# Patient Record
Sex: Female | Born: 1945 | Race: White | Hispanic: No | Smoking: Never smoker
Health system: Southern US, Community
[De-identification: ages and names within clinical notes are randomized; demographics above are authoritative.]

## PROBLEM LIST (undated history)

## (undated) DIAGNOSIS — I480 Paroxysmal atrial fibrillation: Secondary | ICD-10-CM

## (undated) DIAGNOSIS — E079 Disorder of thyroid, unspecified: Secondary | ICD-10-CM

## (undated) HISTORY — PX: AUGMENTATION MAMMAPLASTY: SUR837

---

## 1997-11-10 ENCOUNTER — Other Ambulatory Visit: Admission: RE | Admit: 1997-11-10 | Discharge: 1997-11-10 | Payer: Self-pay | Admitting: Obstetrics and Gynecology

## 1998-09-03 ENCOUNTER — Other Ambulatory Visit: Admission: RE | Admit: 1998-09-03 | Discharge: 1998-09-03 | Payer: Self-pay | Admitting: Podiatry

## 1999-01-18 ENCOUNTER — Other Ambulatory Visit: Admission: RE | Admit: 1999-01-18 | Discharge: 1999-01-18 | Payer: Self-pay | Admitting: Obstetrics and Gynecology

## 1999-01-27 ENCOUNTER — Encounter: Payer: Self-pay | Admitting: Obstetrics and Gynecology

## 1999-01-27 ENCOUNTER — Encounter: Admission: RE | Admit: 1999-01-27 | Discharge: 1999-01-27 | Payer: Self-pay | Admitting: Obstetrics and Gynecology

## 2000-02-03 ENCOUNTER — Encounter: Payer: Self-pay | Admitting: Obstetrics and Gynecology

## 2000-02-03 ENCOUNTER — Encounter: Admission: RE | Admit: 2000-02-03 | Discharge: 2000-02-03 | Payer: Self-pay | Admitting: Obstetrics and Gynecology

## 2000-03-23 ENCOUNTER — Other Ambulatory Visit: Admission: RE | Admit: 2000-03-23 | Discharge: 2000-03-23 | Payer: Self-pay | Admitting: Obstetrics and Gynecology

## 2000-03-28 ENCOUNTER — Encounter: Admission: RE | Admit: 2000-03-28 | Discharge: 2000-03-28 | Payer: Self-pay | Admitting: Obstetrics and Gynecology

## 2000-03-28 ENCOUNTER — Encounter: Payer: Self-pay | Admitting: Obstetrics and Gynecology

## 2001-03-29 ENCOUNTER — Encounter: Admission: RE | Admit: 2001-03-29 | Discharge: 2001-03-29 | Payer: Self-pay | Admitting: Obstetrics and Gynecology

## 2001-03-29 ENCOUNTER — Encounter: Payer: Self-pay | Admitting: Obstetrics and Gynecology

## 2001-08-12 ENCOUNTER — Encounter: Payer: Self-pay | Admitting: Emergency Medicine

## 2001-08-12 ENCOUNTER — Emergency Department (HOSPITAL_COMMUNITY): Admission: EM | Admit: 2001-08-12 | Discharge: 2001-08-12 | Payer: Self-pay | Admitting: Emergency Medicine

## 2002-03-13 ENCOUNTER — Other Ambulatory Visit: Admission: RE | Admit: 2002-03-13 | Discharge: 2002-03-13 | Payer: Self-pay | Admitting: Obstetrics and Gynecology

## 2002-04-04 ENCOUNTER — Encounter: Payer: Self-pay | Admitting: Obstetrics and Gynecology

## 2002-04-04 ENCOUNTER — Encounter: Admission: RE | Admit: 2002-04-04 | Discharge: 2002-04-04 | Payer: Self-pay | Admitting: Obstetrics and Gynecology

## 2002-12-11 ENCOUNTER — Inpatient Hospital Stay (HOSPITAL_COMMUNITY): Admission: EM | Admit: 2002-12-11 | Discharge: 2002-12-12 | Payer: Self-pay | Admitting: Emergency Medicine

## 2003-04-09 ENCOUNTER — Encounter: Admission: RE | Admit: 2003-04-09 | Discharge: 2003-04-09 | Payer: Self-pay | Admitting: Obstetrics and Gynecology

## 2003-09-10 ENCOUNTER — Other Ambulatory Visit: Admission: RE | Admit: 2003-09-10 | Discharge: 2003-09-10 | Payer: Self-pay | Admitting: Obstetrics and Gynecology

## 2004-05-26 ENCOUNTER — Encounter: Admission: RE | Admit: 2004-05-26 | Discharge: 2004-05-26 | Payer: Self-pay | Admitting: Obstetrics and Gynecology

## 2005-07-22 ENCOUNTER — Emergency Department (HOSPITAL_COMMUNITY): Admission: EM | Admit: 2005-07-22 | Discharge: 2005-07-22 | Payer: Self-pay | Admitting: Emergency Medicine

## 2006-01-29 ENCOUNTER — Encounter: Admission: RE | Admit: 2006-01-29 | Discharge: 2006-01-29 | Payer: Self-pay | Admitting: Obstetrics and Gynecology

## 2007-02-18 ENCOUNTER — Encounter: Admission: RE | Admit: 2007-02-18 | Discharge: 2007-02-18 | Payer: Self-pay | Admitting: Obstetrics and Gynecology

## 2008-03-02 ENCOUNTER — Encounter: Admission: RE | Admit: 2008-03-02 | Discharge: 2008-03-02 | Payer: Self-pay | Admitting: Obstetrics and Gynecology

## 2009-03-15 ENCOUNTER — Encounter: Admission: RE | Admit: 2009-03-15 | Discharge: 2009-03-15 | Payer: Self-pay | Admitting: Obstetrics and Gynecology

## 2010-01-30 ENCOUNTER — Encounter: Payer: Self-pay | Admitting: Obstetrics and Gynecology

## 2010-02-18 ENCOUNTER — Other Ambulatory Visit: Payer: Self-pay | Admitting: Obstetrics and Gynecology

## 2010-02-18 DIAGNOSIS — Z1231 Encounter for screening mammogram for malignant neoplasm of breast: Secondary | ICD-10-CM

## 2010-03-28 ENCOUNTER — Ambulatory Visit
Admission: RE | Admit: 2010-03-28 | Discharge: 2010-03-28 | Disposition: A | Discharge status: Home / Self Care | Payer: BC Managed Care – PPO | Source: Ambulatory Visit | Attending: Obstetrics and Gynecology | Admitting: Obstetrics and Gynecology

## 2010-03-28 DIAGNOSIS — Z1231 Encounter for screening mammogram for malignant neoplasm of breast: Secondary | ICD-10-CM

## 2010-03-31 ENCOUNTER — Other Ambulatory Visit: Payer: Self-pay | Admitting: Obstetrics and Gynecology

## 2010-03-31 DIAGNOSIS — R928 Other abnormal and inconclusive findings on diagnostic imaging of breast: Secondary | ICD-10-CM

## 2010-04-06 ENCOUNTER — Ambulatory Visit
Admission: RE | Admit: 2010-04-06 | Discharge: 2010-04-06 | Disposition: A | Discharge status: Home / Self Care | Payer: BC Managed Care – PPO | Source: Ambulatory Visit | Attending: Obstetrics and Gynecology | Admitting: Obstetrics and Gynecology

## 2010-04-06 DIAGNOSIS — R928 Other abnormal and inconclusive findings on diagnostic imaging of breast: Secondary | ICD-10-CM

## 2011-04-10 ENCOUNTER — Other Ambulatory Visit: Payer: Self-pay | Admitting: Obstetrics and Gynecology

## 2011-04-10 DIAGNOSIS — Z1231 Encounter for screening mammogram for malignant neoplasm of breast: Secondary | ICD-10-CM

## 2011-04-19 ENCOUNTER — Ambulatory Visit
Admission: RE | Admit: 2011-04-19 | Discharge: 2011-04-19 | Disposition: A | Discharge status: Home / Self Care | Payer: BC Managed Care – PPO | Source: Ambulatory Visit | Attending: Obstetrics and Gynecology | Admitting: Obstetrics and Gynecology

## 2011-04-19 DIAGNOSIS — Z1231 Encounter for screening mammogram for malignant neoplasm of breast: Secondary | ICD-10-CM

## 2012-04-01 ENCOUNTER — Other Ambulatory Visit: Payer: Self-pay | Admitting: Obstetrics and Gynecology

## 2012-04-01 ENCOUNTER — Other Ambulatory Visit: Payer: Self-pay

## 2012-04-01 DIAGNOSIS — Z1231 Encounter for screening mammogram for malignant neoplasm of breast: Secondary | ICD-10-CM

## 2012-05-08 ENCOUNTER — Ambulatory Visit
Admission: RE | Admit: 2012-05-08 | Discharge: 2012-05-08 | Disposition: A | Discharge status: Home / Self Care | Payer: Medicare Other | Source: Ambulatory Visit

## 2012-05-08 DIAGNOSIS — Z1231 Encounter for screening mammogram for malignant neoplasm of breast: Secondary | ICD-10-CM

## 2013-05-23 ENCOUNTER — Other Ambulatory Visit: Payer: Self-pay

## 2013-05-23 DIAGNOSIS — Z1231 Encounter for screening mammogram for malignant neoplasm of breast: Secondary | ICD-10-CM

## 2013-06-18 ENCOUNTER — Ambulatory Visit
Admission: RE | Admit: 2013-06-18 | Discharge: 2013-06-18 | Disposition: A | Discharge status: Home / Self Care | Payer: Medicare Other | Source: Ambulatory Visit

## 2013-06-18 ENCOUNTER — Encounter (INDEPENDENT_AMBULATORY_CARE_PROVIDER_SITE_OTHER): Payer: Self-pay

## 2013-06-18 DIAGNOSIS — Z1231 Encounter for screening mammogram for malignant neoplasm of breast: Secondary | ICD-10-CM

## 2014-06-16 ENCOUNTER — Other Ambulatory Visit: Payer: Self-pay

## 2014-06-16 DIAGNOSIS — Z1231 Encounter for screening mammogram for malignant neoplasm of breast: Secondary | ICD-10-CM

## 2014-06-29 ENCOUNTER — Ambulatory Visit
Admission: RE | Admit: 2014-06-29 | Discharge: 2014-06-29 | Disposition: A | Discharge status: Home / Self Care | Payer: Medicare Other | Source: Ambulatory Visit

## 2014-06-29 DIAGNOSIS — Z1231 Encounter for screening mammogram for malignant neoplasm of breast: Secondary | ICD-10-CM

## 2015-05-16 ENCOUNTER — Emergency Department (HOSPITAL_BASED_OUTPATIENT_CLINIC_OR_DEPARTMENT_OTHER): Payer: Medicare Other

## 2015-05-16 ENCOUNTER — Encounter (HOSPITAL_BASED_OUTPATIENT_CLINIC_OR_DEPARTMENT_OTHER): Payer: Self-pay

## 2015-05-16 ENCOUNTER — Emergency Department (HOSPITAL_BASED_OUTPATIENT_CLINIC_OR_DEPARTMENT_OTHER)
Admission: EM | Admit: 2015-05-16 | Discharge: 2015-05-16 | Disposition: A | Discharge status: Home / Self Care | Payer: Medicare Other | Attending: Emergency Medicine | Admitting: Emergency Medicine

## 2015-05-16 DIAGNOSIS — M549 Dorsalgia, unspecified: Secondary | ICD-10-CM | POA: Diagnosis not present

## 2015-05-16 DIAGNOSIS — Z7982 Long term (current) use of aspirin: Secondary | ICD-10-CM | POA: Diagnosis not present

## 2015-05-16 DIAGNOSIS — Z79899 Other long term (current) drug therapy: Secondary | ICD-10-CM | POA: Insufficient documentation

## 2015-05-16 DIAGNOSIS — I491 Atrial premature depolarization: Secondary | ICD-10-CM | POA: Diagnosis not present

## 2015-05-16 DIAGNOSIS — I493 Ventricular premature depolarization: Secondary | ICD-10-CM | POA: Insufficient documentation

## 2015-05-16 DIAGNOSIS — R0602 Shortness of breath: Secondary | ICD-10-CM | POA: Diagnosis present

## 2015-05-16 DIAGNOSIS — R0600 Dyspnea, unspecified: Secondary | ICD-10-CM

## 2015-05-16 HISTORY — DX: Disorder of thyroid, unspecified: E07.9

## 2015-05-16 LAB — TROPONIN I: TROPONIN I: 0.03 ng/mL (ref <0.031)

## 2015-05-16 LAB — BASIC METABOLIC PANEL
Anion gap: 5 (ref 5–15)
BUN: 17 mg/dL (ref 6–20)
CALCIUM: 9.2 mg/dL (ref 8.9–10.3)
CO2: 27 mmol/L (ref 22–32)
CREATININE: 0.95 mg/dL (ref 0.44–1.00)
Chloride: 107 mmol/L (ref 101–111)
GFR calc Af Amer: >60 mL/min (ref >60)
Glucose, Bld: 185 mg/dL — ABNORMAL HIGH (ref 65–99)
Potassium: 3.9 mmol/L (ref 3.5–5.1)
SODIUM: 139 mmol/L (ref 135–145)

## 2015-05-16 LAB — CBC WITH DIFFERENTIAL/PLATELET
BASOS ABS: 0 10*3/uL (ref 0.0–0.1)
BASOS PCT: 0 %
EOS PCT: 1 %
Eosinophils Absolute: 0.1 10*3/uL (ref 0.0–0.7)
HCT: 38.6 % (ref 36.0–46.0)
Hemoglobin: 13.1 g/dL (ref 12.0–15.0)
Lymphocytes Relative: 40 %
Lymphs Abs: 2.7 10*3/uL (ref 0.7–4.0)
MCH: 33.1 pg (ref 26.0–34.0)
MCHC: 33.9 g/dL (ref 30.0–36.0)
MCV: 97.5 fL (ref 78.0–100.0)
MONO ABS: 0.6 10*3/uL (ref 0.1–1.0)
Monocytes Relative: 9 %
Neutro Abs: 3.3 10*3/uL (ref 1.7–7.7)
Neutrophils Relative %: 50 %
PLATELETS: 277 10*3/uL (ref 150–400)
RBC: 3.96 MIL/uL (ref 3.87–5.11)
RDW: 12.5 % (ref 11.5–15.5)
WBC: 6.7 10*3/uL (ref 4.0–10.5)

## 2015-05-16 LAB — D-DIMER, QUANTITATIVE: D-Dimer, Quant: 2.18 ug/mL-FEU — ABNORMAL HIGH (ref 0.00–0.50)

## 2015-05-16 MED ORDER — IOPAMIDOL (ISOVUE-370) INJECTION 76%
100.0000 mL | Freq: Once | INTRAVENOUS | Status: AC | PRN
  Administered 2015-05-16: 100 mL via INTRAVENOUS

## 2015-05-16 NOTE — ED Provider Notes (Signed)
CSN: [id number]     Arrival date & time 05/16/15  1906 History  By signing my name below, I, Phillis Haggis, attest that this documentation has been prepared under the direction and in the presence of Benjiman Core, MD. Electronically Signed: Phillis Haggis, ED Scribe. 05/16/2015. 7:33 PM.   Chief Complaint  Patient presents with  . Shortness of Breath   The history is provided by the patient. No language interpreter was used.  HPI Comments: Tamyka D Schoenfelder is a 70 y.o. female who presents to the Emergency Department complaining of constant, gradually worsening SOB onset 2 days ago. She states that it is hard for her to take a deep breath. Pt reports associated chest "heaviness" and aching midline back pain. She has a mild non-productive cough. Pt is currently wearing a heart monitor by her PCP investing possible irregular heartbeat. She has been wearing this monitor since last Wednesday with no alerts. Pt has been having intermittent palpitations, but not as much since the monitor has been placed. Pt states that she played sports on 05/13/15 with no limitations of activity. She denies hx of similar symptoms, recent long travel, fever, chills, or leg swelling. Pt is not a smoker.  Past Medical History  Diagnosis Date  . Thyroid disease    History reviewed. No pertinent past surgical history. No family history on file. Social History  Substance Use Topics  . Smoking status: Never Smoker   . Smokeless tobacco: None  . Alcohol Use: No   OB History    No data available     Review of Systems  Constitutional: Negative for fever and chills.  Respiratory: Positive for cough and shortness of breath.   Cardiovascular: Positive for chest pain. Negative for leg swelling.  Musculoskeletal: Positive for back pain.  All other systems reviewed and are negative.  Allergies  Review of patient's allergies indicates no known allergies.  Home Medications   Prior to Admission medications    Medication Sig Start Date End Date Taking? Authorizing Provider  aspirin (ASPIRIN EC) 81 MG EC tablet Take 81 mg by mouth daily. Swallow whole.   Yes Historical Provider, MD  levothyroxine (SYNTHROID, LEVOTHROID) 88 MCG tablet Take 88 mcg by mouth daily before breakfast.   Yes Historical Provider, MD   BP 133/85 mmHg  Pulse 73  Temp(Src) 98.3 F (36.8 C) (Oral)  Resp 23  SpO2 93% Physical Exam  Constitutional: She is oriented to person, place, and time. She appears well-developed and well-nourished.  HENT:  Head: Normocephalic and atraumatic.  Eyes: EOM are normal. Pupils are equal, round, and reactive to light.  Neck: Normal range of motion. Neck supple.  Cardiovascular: Normal rate, regular rhythm and normal heart sounds.  Exam reveals no gallop and no friction rub.   No murmur heard. Pulmonary/Chest: Effort normal and breath sounds normal. She has no wheezes.  Abdominal: Soft. There is no tenderness.  Musculoskeletal: Normal range of motion. She exhibits no edema.  No peripheral edema  Neurological: She is alert and oriented to person, place, and time.  Skin: Skin is warm and dry.  Psychiatric: She has a normal mood and affect. Her behavior is normal.  Nursing note and vitals reviewed.   ED Course  Procedures (including critical care time) DIAGNOSTIC STUDIES: Oxygen Saturation is 99% on RA, normal by my interpretation.    COORDINATION OF CARE: 7:29 PM-Discussed treatment plan which includes labs, EKG and x-ray with pt at bedside and pt agreed to plan.    Labs  Review Labs Reviewed  BASIC METABOLIC PANEL - Abnormal; Notable for the following:    Glucose, Bld 185 (*)    All other components within normal limits  D-[facility], [facility]ANTITAT[facility] ([facility]) - Abnormal; Notable for the following:    D-Dimer, Quant 2.18 (*)    All other components within normal limits  TROPONIN I  CBC WITH DIFFERENTIAL/PLATELET    Imaging Review Dg Chest 2 View  05/16/2015  CLINICAL DATA:   70 year old female with shortness of breath and chest heaviness. EXAM: CHEST  2 VIEW COMPARISON:  None. FINDINGS: Two views of the chest demonstrate minimal bibasilar atelectatic changes. There is no focal consolidation. There is slight blunting of the costophrenic angles which may be related to chronic changes/scarring or represent a small amount of pleural effusion. There is mild increased thoracic AP diameter concerning for underlying obstructive lung disease. There is no pneumothorax. The cardiac silhouette is within normal limits. Bilateral breast implants noted. The visualized soft tissues and the osseous structures are grossly unremarkable. IMPRESSION: Evidence of chronic obstructive pulmonary disease with possible trace bilateral pleural effusions. No focal consolidation. Electronically Signed   By: Elgie CollardArash  Radparvar M.D.   On: 05/16/2015 20:10   Ct Angio Chest Pe W/cm &/or Wo Cm  05/16/2015  CLINICAL DATA:  Increasing dyspnea and cough. EXAM: CT ANGIOGRAPHY CHEST WITH CONTRAST TECHNIQUE: Multidetector CT imaging of the chest was performed using the standard protocol during bolus administration of intravenous contrast. Multiplanar CT image reconstructions and MIPs were obtained to evaluate the vascular anatomy. CONTRAST:  100 mL Isovue 370 intravenous COMPARISON:  Radiographs 05/16/2015 FINDINGS: Cardiovascular: There is good opacification of the pulmonary arteries. There is no pulmonary embolism. The thoracic aorta is normal in caliber and intact. Lungs: Clear Central airways: Normal Effusions: Small pleural effusions bilaterally. Lymphadenopathy: None Esophagus: Unremarkable Upper abdomen: Small hiatal hernia. Musculoskeletal: No significant skeletal lesion. Moderate thoracic degenerative disc disease. Review of the MIP images confirms the above findings. IMPRESSION: Negative for acute pulmonary embolism. There are small pleural effusions bilaterally. There is a small hiatal hernia. Electronically Signed    By: Ellery Plunkaniel R Mitchell M.D.   On: 05/16/2015 22:27   I have personally reviewed and evaluated these images and lab results as part of my medical decision-making.   EKG Interpretation None     ED ECG REPORT   Date: 05/17/2015  Rate: 70  Rhythm: normal sinus rhythm and premature ventricular contractions (PVC)  QRS Axis: normal  Intervals: normal  ST/T Wave abnormalities: normal  Conduction Disutrbances:none  Narrative Interpretation:   Old EKG Reviewed: none available    MDM   Final diagnoses:  Dyspnea  PAC (premature atrial contraction)  PVC (premature ventricular contraction)    Patient with dyspnea chest pain and palpitations. Has frequent PACs and PVCs. D-dimer done and was positive. CT scan reassuring. Will discharge home. Has follow-up with his primary care doctor. She is currently wearing monitor also. I personally performed the services described in this documentation, which was scribed in my presence. The recorded information has been reviewed and is accurate.      Benjiman CoreNathan Opal Dinning, MD 05/17/15 0002

## 2015-05-16 NOTE — ED Notes (Signed)
Patient transported to X-ray 

## 2015-05-16 NOTE — Discharge Instructions (Signed)
Premature Atrial Contraction Premature atrial contractions (PACs) happen when your heart beats before it has had time to fill with blood. Your heart then has to pause until it can fill with blood for the next beat. This causes the next beat to be more forceful. PACs are also called skipped heartbeats because it may feel like your heart stops for a second.  Your heart has four chambers. There are two upper chambers (atria) and two lower chambers (ventricles). All the chambers need to work together to pump blood properly. Electrical signals spread across your heart and make all the chambers beat together.The signal to beat starts in your atria. If the atria fire a bit early, you may have a PAC.  CAUSES  The cause of a PAC is often unknown. PACs are sometimes caused by heart disease or injury. RISK FACTORS PACs are more common in children and older people. Other risk factors that may trigger PACs include:  Caffeine.  Stress.  Fatigue.  Alcohol.  Smoking.  Stimulant drugs. These may be prescription or illegal drugs.  Heart disease. SIGNS AND SYMPTOMS PACs are very common, especially in children and people 50 years and older. PACs do not cause dizziness, shortness of breath, or chest pain. The only symptom of a PAC is the sensation of a skipped or fluttering heartbeat.  DIAGNOSIS  Your health care provider can diagnose PAC based on the description of your symptom. Your health care provider may also:  Perform a physical exam to listen to your heart. Your heart may sound normal during this exam.  Perform tests to rule out other conditions. These tests may include an electrical tracing of your heart called electrocardiogram (ECG). You may need to wear a portable ECG machine (Holter monitor) that records your heart for 24 hours or more. TREATMENT  In most cases, PACs do not need to be treated. If you have frequent PACs that are caused by heart disease, you may be treated for the underlying  condition. HOME CARE INSTRUCTIONS  Do not use any tobacco products, including cigarettes, chewing tobacco, or electronic cigarettes. If you need help quitting, ask your health care provider.  Limit alcohol intake to no more than 1 drink per day for nonpregnant women and 2 drinks per day for men. One drink equals 12 ounces of beer, 5 ounces of wine, or 1 ounces of hard liquor.  Limit the amount of caffeine you take in.  Do not use illegal drugs.  Get at least 8 hours of sleep every night.  Find healthy ways to manage stress.  Get regular exercise. Ask your health care provider to suggest some activities that are safe for you. SEEK MEDICAL CARE IF:  You feel your heart skipping beats often (more than once a day).  Your heart skips beats and you feel dizzy, lightheaded, or very tired. SEEK IMMEDIATE MEDICAL CARE IF:   You have chest pain.  You have trouble breathing.   This information is not intended to replace advice given to you by your health care provider. Make sure you discuss any questions you have with your health care provider.   Document Released: 08/29/2013 Document Revised: 05/12/2014 Document Reviewed: 08/29/2013 Elsevier Interactive Patient Education 2016 Elsevier Inc.  Premature Ventricular Contraction A premature ventricular contraction is an irregularity in the normal heart rhythm. These contractions are extra heartbeats that occur too early in the normal sequence. In most cases, these contractions are harmless and do not require treatment. CAUSES Premature ventricular contractions may occur without  a known cause. In healthy people, the extra contractions may be caused by:  Smoking.  Drinking alcohol.  Caffeine.  Certain medicines.  Some illegal drugs.  Stress. Sometimes, changes in chemicals in the blood (electrolytes) can also cause premature ventricular contractions. They can also occur in people with heart diseases that cause a decrease in blood  flow to the heart. SIGNS AND SYMPTOMS Premature ventricular contractions often do not cause any symptoms. In some cases, you may have a feeling of your heart beating fast or skipping a beat (palpitations). DIAGNOSIS Your health care provider will take your medical history and do a physical exam. During the exam, the health care provider will check for irregular heartbeats. Various tests may be done to help diagnose premature ventricular contractions. These tests may include:  An ECG (electrocardiogram) to monitor the electrical activity of your heart.  Holter monitor testing. A Holter monitor is a portable device that can monitor the electrical activity of your heart over longer periods of time.  Stress tests to see how exercise affects your heart rhythm.  Echocardiogram. This test uses sound waves (ultrasound) to produce an image of your heart.  Electrophysiology study. This is used to evaluate the electrical conduction system of your heart. TREATMENT Usually, no treatment is needed. You may be advised to avoid things that can trigger the premature contractions, such as caffeine or alcohol. Medicines are sometimes given if symptoms are severe or if the extra heartbeats are very frequent. Treatment may also be needed for an underlying cause of the contractions if one is found. HOME CARE INSTRUCTIONS  Take medicines only as directed by your health care provider.  Make any lifestyle changes recommended by your health care provider. These may include:  Quitting smoking.  Avoiding or limiting caffeine or alcohol.  Exercising. Talk to your health care provider about what type of exercise is safe for you.  Trying to reduce stress.  Keep all follow-up visits with your health care provider. This is important. SEEK IMMEDIATE MEDICAL CARE IF:  You feel palpitations that are frequent or continual.  You have chest pain.  You have shortness of breath.  You have sweating for no  reason.  You have nausea and vomiting.  You become light-headed or faint.   This information is not intended to replace advice given to you by your health care provider. Make sure you discuss any questions you have with your health care provider.   Document Released: 08/13/2003 Document Revised: 01/16/2014 Document Reviewed: 05/29/2013 Elsevier Interactive Patient Education [employer] Elsevier Inc.

## 2015-05-16 NOTE — ED Notes (Signed)
Fluctuations of oxygen level present with PAC and PVC down from 98% to 92 %. Placed pt on 1LNC due to pt's c/o of SOB and current reading.

## 2015-05-16 NOTE — ED Notes (Signed)
Nurse First: Pt observed walking in, attempting to catch break. Oxygen sats: 99% and HR 72.

## 2015-05-16 NOTE — ED Notes (Signed)
Pt reports having some shortness of breath and chest "heaviness" x 2 days. Reports she is wearing heart monitor due to PCP investigating irregular heartbeat.

## 2015-07-07 ENCOUNTER — Other Ambulatory Visit: Payer: Self-pay | Admitting: Obstetrics and Gynecology

## 2015-07-07 DIAGNOSIS — Z1231 Encounter for screening mammogram for malignant neoplasm of breast: Secondary | ICD-10-CM

## 2015-08-02 ENCOUNTER — Ambulatory Visit
Admission: RE | Admit: 2015-08-02 | Discharge: 2015-08-02 | Disposition: A | Discharge status: Home / Self Care | Payer: Medicare Other | Source: Ambulatory Visit | Attending: Obstetrics and Gynecology | Admitting: Obstetrics and Gynecology

## 2015-08-02 DIAGNOSIS — Z1231 Encounter for screening mammogram for malignant neoplasm of breast: Secondary | ICD-10-CM

## 2016-08-02 ENCOUNTER — Other Ambulatory Visit: Payer: Self-pay | Admitting: Obstetrics and Gynecology

## 2016-08-02 DIAGNOSIS — Z1239 Encounter for other screening for malignant neoplasm of breast: Secondary | ICD-10-CM

## 2016-08-28 ENCOUNTER — Ambulatory Visit
Admission: RE | Admit: 2016-08-28 | Discharge: 2016-08-28 | Disposition: A | Discharge status: Home / Self Care | Payer: Medicare Other | Source: Ambulatory Visit | Attending: Obstetrics and Gynecology | Admitting: Obstetrics and Gynecology

## 2016-08-28 DIAGNOSIS — Z1239 Encounter for other screening for malignant neoplasm of breast: Secondary | ICD-10-CM

## 2017-10-17 ENCOUNTER — Other Ambulatory Visit: Payer: Self-pay | Admitting: Obstetrics and Gynecology

## 2017-10-17 DIAGNOSIS — Z1231 Encounter for screening mammogram for malignant neoplasm of breast: Secondary | ICD-10-CM

## 2017-11-26 ENCOUNTER — Other Ambulatory Visit: Payer: Self-pay | Admitting: Family Medicine

## 2017-11-26 ENCOUNTER — Ambulatory Visit
Admission: RE | Admit: 2017-11-26 | Discharge: 2017-11-26 | Disposition: A | Discharge status: Home / Self Care | Payer: Medicare Other | Source: Ambulatory Visit | Attending: Obstetrics and Gynecology | Admitting: Obstetrics and Gynecology

## 2017-11-26 DIAGNOSIS — Z1231 Encounter for screening mammogram for malignant neoplasm of breast: Secondary | ICD-10-CM

## 2018-11-18 ENCOUNTER — Other Ambulatory Visit: Payer: Self-pay | Admitting: Family Medicine

## 2018-11-18 DIAGNOSIS — Z1231 Encounter for screening mammogram for malignant neoplasm of breast: Secondary | ICD-10-CM

## 2019-01-09 ENCOUNTER — Other Ambulatory Visit: Payer: Self-pay

## 2019-01-09 ENCOUNTER — Ambulatory Visit
Admission: RE | Admit: 2019-01-09 | Discharge: 2019-01-09 | Disposition: A | Discharge status: Home / Self Care | Payer: Medicare Other | Source: Ambulatory Visit | Attending: Family Medicine | Admitting: Family Medicine

## 2019-01-09 DIAGNOSIS — Z1231 Encounter for screening mammogram for malignant neoplasm of breast: Secondary | ICD-10-CM

## 2019-01-14 ENCOUNTER — Other Ambulatory Visit: Payer: Self-pay | Admitting: Family Medicine

## 2019-01-14 DIAGNOSIS — R928 Other abnormal and inconclusive findings on diagnostic imaging of breast: Secondary | ICD-10-CM

## 2019-01-15 ENCOUNTER — Other Ambulatory Visit: Payer: Self-pay | Admitting: Family Medicine

## 2019-01-15 DIAGNOSIS — R928 Other abnormal and inconclusive findings on diagnostic imaging of breast: Secondary | ICD-10-CM

## 2019-01-16 ENCOUNTER — Other Ambulatory Visit: Payer: Self-pay

## 2019-01-16 ENCOUNTER — Ambulatory Visit: Payer: Medicare Other

## 2019-01-16 ENCOUNTER — Ambulatory Visit
Admission: RE | Admit: 2019-01-16 | Discharge: 2019-01-16 | Disposition: A | Discharge status: Home / Self Care | Payer: Medicare Other | Source: Ambulatory Visit | Attending: Family Medicine | Admitting: Family Medicine

## 2019-01-16 DIAGNOSIS — R928 Other abnormal and inconclusive findings on diagnostic imaging of breast: Secondary | ICD-10-CM

## 2019-03-09 ENCOUNTER — Ambulatory Visit: Payer: Medicare PPO | Attending: Internal Medicine

## 2019-03-09 DIAGNOSIS — Z23 Encounter for immunization: Secondary | ICD-10-CM | POA: Insufficient documentation

## 2019-03-09 NOTE — Progress Notes (Signed)
   Covid-19 Vaccination Clinic  Name:  Alyssa Wheeler    MRN: [id number] DOB: 01-12-1945  03/09/2019  Alyssa Wheeler was observed post Covid-19 immunization for 15 minutes without incidence. She was provided with Vaccine Information Sheet and instruction to access the V-Safe system.   Alyssa Wheeler was instructed to call 911 with any severe reactions post vaccine: Marland Kitchen Difficulty breathing  . Swelling of your face and throat  . A fast heartbeat  . A bad rash all over your body  . Dizziness and weakness    Immunizations Administered    Name Date Dose VIS Date Route   Pfizer COVID-19 Vaccine 03/09/2019  3:54 PM 0.3 mL 12/20/2018 Intramuscular   Manufacturer: [employer], [employer]   Lot: [id number]   NDC: [address]-9

## 2019-04-08 ENCOUNTER — Ambulatory Visit: Payer: Medicare PPO | Attending: Internal Medicine

## 2019-04-08 DIAGNOSIS — Z23 Encounter for immunization: Secondary | ICD-10-CM

## 2019-04-08 NOTE — Progress Notes (Signed)
   Covid-19 Vaccination Clinic  Name:  Alyssa Wheeler    MRN: [id number] DOB: 1945/04/16  04/08/2019  [name] was observed post Covid-19 immunization for 15 minutes without incident. She was provided with Vaccine Information Sheet and instruction to access the V-Safe system.   [name] was instructed to call 911 with any severe reactions post vaccine: Marland Kitchen Difficulty breathing  . Swelling of face and throat  . A fast heartbeat  . A bad rash all over body  . Dizziness and weakness   Immunizations Administered    Name Date Dose VIS Date Route   Pfizer COVID-19 Vaccine 04/08/2019  1:55 PM 0.3 mL 12/20/2018 Intramuscular   Manufacturer: [employer], [employer]   Lot: [id number]   NDC: [address]-4

## 2020-02-23 ENCOUNTER — Other Ambulatory Visit: Payer: Self-pay | Admitting: Family Medicine

## 2020-02-23 DIAGNOSIS — Z1231 Encounter for screening mammogram for malignant neoplasm of breast: Secondary | ICD-10-CM

## 2020-04-12 ENCOUNTER — Other Ambulatory Visit: Payer: Self-pay

## 2020-04-12 ENCOUNTER — Ambulatory Visit
Admission: RE | Admit: 2020-04-12 | Discharge: 2020-04-12 | Disposition: A | Discharge status: Home / Self Care | Payer: Medicare PPO | Source: Ambulatory Visit | Attending: Family Medicine | Admitting: Family Medicine

## 2020-04-12 DIAGNOSIS — Z1231 Encounter for screening mammogram for malignant neoplasm of breast: Secondary | ICD-10-CM

## 2020-12-23 ENCOUNTER — Encounter (HOSPITAL_BASED_OUTPATIENT_CLINIC_OR_DEPARTMENT_OTHER): Payer: Self-pay | Admitting: Emergency Medicine

## 2020-12-23 ENCOUNTER — Other Ambulatory Visit: Payer: Self-pay

## 2020-12-23 ENCOUNTER — Emergency Department (HOSPITAL_BASED_OUTPATIENT_CLINIC_OR_DEPARTMENT_OTHER)
Admission: EM | Admit: 2020-12-23 | Discharge: 2020-12-24 | Disposition: A | Discharge status: Other Acute Inpatient Hospital | Payer: Medicare PPO | Attending: Emergency Medicine | Admitting: Emergency Medicine

## 2020-12-23 DIAGNOSIS — Z7982 Long term (current) use of aspirin: Secondary | ICD-10-CM | POA: Diagnosis not present

## 2020-12-23 DIAGNOSIS — I248 Other forms of acute ischemic heart disease: Secondary | ICD-10-CM | POA: Diagnosis not present

## 2020-12-23 DIAGNOSIS — I498 Other specified cardiac arrhythmias: Secondary | ICD-10-CM | POA: Diagnosis not present

## 2020-12-23 DIAGNOSIS — R002 Palpitations: Secondary | ICD-10-CM | POA: Diagnosis present

## 2020-12-23 DIAGNOSIS — Z20822 Contact with and (suspected) exposure to covid-19: Secondary | ICD-10-CM | POA: Insufficient documentation

## 2020-12-23 DIAGNOSIS — I4891 Unspecified atrial fibrillation: Secondary | ICD-10-CM | POA: Diagnosis not present

## 2020-12-23 HISTORY — DX: Paroxysmal atrial fibrillation: I48.0

## 2020-12-23 LAB — CBC WITH DIFFERENTIAL/PLATELET
Abs Immature Granulocytes: 0.03 10*3/uL (ref 0.00–0.07)
Basophils Absolute: 0.1 10*3/uL (ref 0.0–0.1)
Basophils Relative: 1 %
Eosinophils Absolute: 0.1 10*3/uL (ref 0.0–0.5)
Eosinophils Relative: 1 %
HCT: 39.7 % (ref 36.0–46.0)
Hemoglobin: 13.6 g/dL (ref 12.0–15.0)
Immature Granulocytes: 0 %
Lymphocytes Relative: 31 %
Lymphs Abs: 3 10*3/uL (ref 0.7–4.0)
MCH: 32.9 pg (ref 26.0–34.0)
MCHC: 34.3 g/dL (ref 30.0–36.0)
MCV: 95.9 fL (ref 80.0–100.0)
Monocytes Absolute: 0.9 10*3/uL (ref 0.1–1.0)
Monocytes Relative: 9 %
Neutro Abs: 5.8 10*3/uL (ref 1.7–7.7)
Neutrophils Relative %: 58 %
Platelets: 270 10*3/uL (ref 150–400)
RBC: 4.14 MIL/uL (ref 3.87–5.11)
RDW: 12.5 % (ref 11.5–15.5)
WBC: 9.8 10*3/uL (ref 4.0–10.5)
nRBC: 0 % (ref 0.0–0.2)

## 2020-12-23 MED ORDER — DILTIAZEM LOAD VIA INFUSION
20.0000 mg | Freq: Once | INTRAVENOUS | Status: AC
  Administered 2020-12-23: 20 mg via INTRAVENOUS
  Filled 2020-12-23: qty 20

## 2020-12-23 MED ORDER — DILTIAZEM HCL-DEXTROSE 125-5 MG/125ML-% IV SOLN (PREMIX)
INTRAVENOUS | Status: AC
  Filled 2020-12-23: qty 125

## 2020-12-23 MED ORDER — SODIUM CHLORIDE 0.9 % IV SOLN: INTRAVENOUS | Status: DC | PRN

## 2020-12-23 MED ORDER — DILTIAZEM HCL-DEXTROSE 125-5 MG/125ML-% IV SOLN (PREMIX)
5.0000 mg/h | INTRAVENOUS | Status: DC
  Administered 2020-12-23: 5 mg/h via INTRAVENOUS

## 2020-12-23 MED ORDER — DILTIAZEM HCL 25 MG/5ML IV SOLN
INTRAVENOUS | Status: AC
  Filled 2020-12-23: qty 5

## 2020-12-23 NOTE — ED Triage Notes (Signed)
C/o palpitations and " chest pressure " x 3 hrs ago hx of Afib

## 2020-12-23 NOTE — ED Provider Notes (Addendum)
[facility] Provider Note: Lowella Dell, MD, FACEP  CSN: [id number] MRN: [id number] ARRIVAL: 12/23/20 at 2331 ROOM: [facility]07/[facility]07   CHIEF COMPLAINT  Palpitations   HISTORY OF PRESENT ILLNESS  12/23/20 11:48 PM Alyssa Wheeler is a 75 y.o. female with a history of paroxysmal atrial fibrillation.  She is here with palpitations and chest pressure that began about 3 hours ago.  Symptoms were initially mild but worsened over the last hour.  The palpitations are described as a sense of rapid heartbeat.  She rates her chest discomfort as a 1 out of 10.  She denies shortness of breath or lightheadedness.  Nothing makes her symptoms better or worse.   Past Medical History:  Diagnosis Date   Paroxysmal atrial fibrillation (HCC)    Thyroid disease     Past Surgical History:  Procedure Laterality Date   AUGMENTATION MAMMAPLASTY Bilateral     Family History  Problem Relation Age of Onset   Breast cancer Neg Hx     Social History   Tobacco Use   Smoking status: Never  Substance Use Topics   Alcohol use: Yes    Comment: soc    Prior to Admission medications   Medication Sig Start Date End Date Taking? Authorizing Provider  aspirin (ASPIRIN EC) 81 MG EC tablet Take 81 mg by mouth daily. Swallow whole.    [provider]  diltiazem (CARDIZEM CD) 120 MG 24 hr capsule Take 120 mg by mouth daily. 11/16/20   [provider]  levothyroxine (SYNTHROID) 50 MCG tablet Take 50 mcg by mouth daily. 11/14/20   [provider]  levothyroxine (SYNTHROID, LEVOTHROID) 88 MCG tablet Take 88 mcg by mouth daily before breakfast.    [provider]    Allergies Patient has no known allergies.   REVIEW OF SYSTEMS  Negative except as noted here or in the History of Present Illness.   PHYSICAL EXAMINATION  Initial Vital Signs Pulse 63, temperature 97.8 F (36.6 C), resp. rate (!) 26, height 5\' 4"  (1.626 m), weight 62.1 kg, SpO2 96 %.  NB:  Examination done after patient given bolus of Cardizem 20 mg.  Examination General: Well-developed, well-nourished female in no acute distress; appearance consistent with age of record HENT: normocephalic; atraumatic Eyes: Normal appearance Neck: supple Heart: regular rate and rhythm with frequent PACs Lungs: clear to auscultation bilaterally Abdomen: soft; nondistended; nontender; bowel sounds present Extremities: No deformity; full range of motion; pulses normal Neurologic: Awake, alert and oriented; motor function intact in all extremities and symmetric; no facial droop Skin: Warm and dry Psychiatric: Flat affect   RESULTS  Summary of this visit's results, reviewed and interpreted by myself:   EKG Interpretation  Date/Time:  Thursday December 23 2020 23:39:00 EST Ventricular Rate:  182 PR Interval:  98 QRS Duration: 96 QT Interval:  280 QTC Calculation: 509 R Axis:   71 Text Interpretation: Afib with RVR Minimal ST depression, inferior leads Nonspecific T abnormalities, lateral leads Confirmed by [id number] (Paula Libra) on 12/23/2020 11:43:21 PM     EKG Interpretation  Date/Time:  Thursday December 23 2020 23:53:41 EST Ventricular Rate:  103 PR Interval:  230 QRS Duration: 88 QT Interval:  350 QTC Calculation: 459 R Axis:   75 Text Interpretation: Sinus tachycardia Prolonged PR interval Nonspecific T abnormalities, lateral leads ST elevation, consider inferior injury Previously AFIB Confirmed by [id number] (Paula Libra) on 12/23/2020 11:56:25 PM        Laboratory Studies: Results for orders placed or  performed during the hospital encounter of 12/23/20 (from the past 24 hour(s))  CBC with Differential/Platelet     Status: None   Collection Time: 12/23/20 11:43 PM  Result Value Ref Range   WBC 9.8 4.0 - 10.5 K/uL   RBC 4.14 3.87 - 5.11 MIL/uL   Hemoglobin 13.6 12.0 - 15.0 g/dL   HCT 39.7 36.0 - 46.0 %   MCV 95.9 80.0 - 100.0 fL   MCH 32.9 26.0 - 34.0 pg   MCHC 34.3  30.0 - 36.0 g/dL   RDW 12.5 11.5 - 15.5 %   Platelets 270 150 - 400 K/uL   nRBC 0.0 0.0 - 0.2 %   Neutrophils Relative % 58 %   Neutro Abs 5.8 1.7 - 7.7 K/uL   Lymphocytes Relative 31 %   Lymphs Abs 3.0 0.7 - 4.0 K/uL   Monocytes Relative 9 %   Monocytes Absolute 0.9 0.1 - 1.0 K/uL   Eosinophils Relative 1 %   Eosinophils Absolute 0.1 0.0 - 0.5 K/uL   Basophils Relative 1 %   Basophils Absolute 0.1 0.0 - 0.1 K/uL   Immature Granulocytes 0 %   Abs Immature Granulocytes 0.03 0.00 - 0.07 K/uL  Basic metabolic panel     Status: Abnormal   Collection Time: 12/23/20 11:43 PM  Result Value Ref Range   Sodium 138 135 - 145 mmol/L   Potassium 3.7 3.5 - 5.1 mmol/L   Chloride 105 98 - 111 mmol/L   CO2 22 22 - 32 mmol/L   Glucose, Bld 144 (H) 70 - 99 mg/dL   BUN 29 (H) 8 - 23 mg/dL   Creatinine, Ser 1.07 (H) 0.44 - 1.00 mg/dL   Calcium 9.1 8.9 - 10.3 mg/dL   GFR, Estimated 55 (L) >60 mL/min   Anion gap 11 5 - 15  Troponin I (High Sensitivity)     Status: Abnormal   Collection Time: 12/23/20 11:43 PM  Result Value Ref Range   Troponin I (High Sensitivity) 53 (H) <18 ng/L  Resp Panel by RT-PCR (Flu A&B, Covid) Nasopharyngeal Swab     Status: None   Collection Time: 12/24/20 12:18 AM   Specimen: Nasopharyngeal Swab; Nasopharyngeal(NP) swabs in vial transport medium  Result Value Ref Range   SARS Coronavirus 2 by RT PCR NEGATIVE NEGATIVE   Influenza A by PCR NEGATIVE NEGATIVE   Influenza B by PCR NEGATIVE NEGATIVE  Troponin I (High Sensitivity)     Status: Abnormal   Collection Time: 12/24/20  1:43 AM  Result Value Ref Range   Troponin I (High Sensitivity) 48 (H) <18 ng/L   Imaging Studies: No results found.  ED COURSE and MDM  Nursing notes, initial and subsequent vitals signs, including pulse oximetry, reviewed and interpreted by myself.  Vitals:   12/24/20 0030 12/24/20 0100 12/24/20 0130 12/24/20 0200  BP: 103/62 94/61 95/81  108/65  Pulse: (!) 55 (!) 51 (!) 55 (!) 55  Resp:  10 19 (!) 25 (!) 23  Temp:      SpO2: 100% 98% 95% 92%  Weight:      Height:       Medications  diltiazem (CARDIZEM) 1 mg/mL load via infusion 20 mg (20 mg Intravenous Bolus from Bag 12/23/20 2351)    And  diltiazem (CARDIZEM) 125 mg in dextrose 5% 125 mL (1 mg/mL) infusion (0 mg/hr Intravenous Stopped 12/24/20 0004)  0.9 %  sodium chloride infusion (0 mLs Intravenous Stopped 12/24/20 0222)  metoprolol tartrate (LOPRESSOR) injection 5 mg (5  mg Intravenous Given 12/24/20 0006)   12:02 AM Patient converted to sinus rhythm after initial Cardizem bolus.  She had a brief episode of bradycardia so the Cardizem drip has been stopped and we will give Lopressor 5 mg.   12:10 AM Patient keeps having significant pauses up to several seconds.  Some pauses have P waves and some do not.  Pacer pads placed.  12:44 AM Heart rate NSR in the 50s, we will continue to observe.  2:11 AM Patient has been in normal sinus rhythm in the 50s since about 12:15 AM with only occasional PACs and no further pauses.  Troponin is slightly elevated, likely due to demand ischemia, and is lower on repeat.   3:43 AM Patient has remained stable since 12:15 AM.  Her pauses, however, are concerning and she may have a baseline intermittent arrhythmia for which a pacemaker would be indicated.  We will transfer her to the cardiology service at [facility], Dr. Grier Rocher accepting.   PROCEDURES  Procedures CRITICAL CARE Performed by: Karen Chafe Blakeleigh Domek Total critical care time: 45 minutes Critical care time was exclusive of separately billable procedures and treating other patients. Critical care was necessary to treat or prevent imminent or life-threatening deterioration. Critical care was time spent personally by me on the following activities: development of treatment plan with patient and/or surrogate as well as nursing, discussions with consultants, evaluation of patient's response to treatment, examination of patient,  obtaining history from patient or surrogate, ordering and performing treatments and interventions, ordering and review of laboratory studies, ordering and review of radiographic studies, pulse oximetry and re-evaluation of patient's condition.   ED DIAGNOSES     ICD-10-CM   1. Atrial fibrillation with RVR (HCC)  I48.91     2. Bradyarrhythmia  I49.8     3. Demand ischemia ([place])  I24.8          Quaniya Damas, MD 12/24/20 0344    Shanon Rosser, MD 12/24/20 0356    Shanon Rosser, MD 12/24/20 [phone]

## 2020-12-23 NOTE — ED Notes (Signed)
ED Provider at bedside. 

## 2020-12-24 DIAGNOSIS — I4891 Unspecified atrial fibrillation: Secondary | ICD-10-CM | POA: Diagnosis not present

## 2020-12-24 DIAGNOSIS — I248 Other forms of acute ischemic heart disease: Secondary | ICD-10-CM | POA: Diagnosis not present

## 2020-12-24 DIAGNOSIS — Z7982 Long term (current) use of aspirin: Secondary | ICD-10-CM | POA: Diagnosis not present

## 2020-12-24 DIAGNOSIS — Z20822 Contact with and (suspected) exposure to covid-19: Secondary | ICD-10-CM | POA: Diagnosis not present

## 2020-12-24 DIAGNOSIS — I498 Other specified cardiac arrhythmias: Secondary | ICD-10-CM | POA: Diagnosis not present

## 2020-12-24 DIAGNOSIS — R002 Palpitations: Secondary | ICD-10-CM | POA: Diagnosis present

## 2020-12-24 LAB — BASIC METABOLIC PANEL
Anion gap: 11 (ref 5–15)
BUN: 29 mg/dL — ABNORMAL HIGH (ref 8–23)
CO2: 22 mmol/L (ref 22–32)
Calcium: 9.1 mg/dL (ref 8.9–10.3)
Chloride: 105 mmol/L (ref 98–111)
Creatinine, Ser: 1.07 mg/dL — ABNORMAL HIGH (ref 0.44–1.00)
GFR, Estimated: 55 mL/min — ABNORMAL LOW (ref >60)
Glucose, Bld: 144 mg/dL — ABNORMAL HIGH (ref 70–99)
Potassium: 3.7 mmol/L (ref 3.5–5.1)
Sodium: 138 mmol/L (ref 135–145)

## 2020-12-24 LAB — TROPONIN I (HIGH SENSITIVITY)
Troponin I (High Sensitivity): 48 ng/L — ABNORMAL HIGH (ref <18)
Troponin I (High Sensitivity): 53 ng/L — ABNORMAL HIGH (ref <18)

## 2020-12-24 LAB — RESP PANEL BY RT-PCR (FLU A&B, COVID) ARPGX2
Influenza A by PCR: NEGATIVE
Influenza B by PCR: NEGATIVE
SARS Coronavirus 2 by RT PCR: NEGATIVE

## 2020-12-24 MED ORDER — METOPROLOL TARTRATE 5 MG/5ML IV SOLN: 5.0000 mg | Freq: Once | INTRAVENOUS | Status: AC

## 2020-12-24 MED ORDER — METOPROLOL TARTRATE 5 MG/5ML IV SOLN
INTRAVENOUS | Status: AC
  Administered 2020-12-24: 5 mg via INTRAVENOUS
  Filled 2020-12-24: qty 5

## 2020-12-24 NOTE — ED Notes (Signed)
Report called to Jake Shark, nurse, at [employer] [facility]. No further questions or concerns.

## 2020-12-24 NOTE — ED Notes (Signed)
Pacer pads placed on pt at this time due to frequent long pauses.

## 2020-12-24 NOTE — ED Notes (Signed)
Pt ambulated with zoll. Discussed with MD. MD at bedside to discuss recommendation for transfer to [facility]. Pt agrees

## 2021-05-02 ENCOUNTER — Ambulatory Visit: Payer: Medicare PPO | Admitting: Pulmonary Disease

## 2021-05-02 ENCOUNTER — Encounter: Payer: Self-pay | Admitting: Pulmonary Disease

## 2021-05-02 VITALS — BP 112/80 | HR 62 | Ht 64.0 in | Wt 135.2 lb

## 2021-05-02 DIAGNOSIS — R0602 Shortness of breath: Secondary | ICD-10-CM | POA: Diagnosis not present

## 2021-05-02 DIAGNOSIS — I4891 Unspecified atrial fibrillation: Secondary | ICD-10-CM | POA: Insufficient documentation

## 2021-05-02 DIAGNOSIS — E039 Hypothyroidism, unspecified: Secondary | ICD-10-CM | POA: Insufficient documentation

## 2021-05-02 NOTE — Patient Instructions (Addendum)
?  Shortness of breath ?--ORDER pulmonary function tests ?--ORDER CT Chest without contrast ?--Encourage regular activity daily  ?--Deep breathing exercises information as attached below ? ?Follow-up with me after PFTs. Please schedule with May or Misti when next available. ? ? ?For deep breathing exercises, I can recommend the following video: https://[email or web address].[email or web address]. If the link does not work, please search "The Breather - Diaphragmatic Breathing Mastery" on YouTube. Try to perform this exercise at least twice a day for 5-10 minutes. Yoga breathing exercises are also appropriate if you have access to those resources. The goal is take effective deep breaths and hold to open up areas of your lungs on daily basis.  ? ? ? ?

## 2021-05-02 NOTE — Progress Notes (Signed)
C xv ? ? ?Subjective:  ? ?PATIENT ID: Alyssa Wheeler GENDER: female DOB: [birth year], MRN: [id number] ? ? ?HPI ? ?Chief Complaint  ?Patient presents with  ? Consult  ?  Had an ablation in feb and hasn't been able to take a deep breath  ? ? ?Reason for Visit: New consult for shortness of breath ? ?[name] is a a 76 year old female never smoker with atrial fibrillation and hypothyroidism who presents as a new patient for evaluation of shortness of breath. ? ?She reports since her ablation she has been unable to take a deep breath. She reports being healthy at baseline and running for leisure until fall 2022 when she started having palpitations. When she returned to [facility] in Feb 2023 she had ablation. Since then she reports her shortness of breath seems to occur randomly and possibly worse at night. Overall has improved with symptoms however reports random onset of intermittent shortness of breath/difficulty taking a deep breath for several hours. Denies wheezing or coughing. Denies known chronic lung issues. Denies history of respiratory infections. Not on any inhalers. Has tried PPI without changes. ? ?Reviewed Cardiology note from 04/20/21 by Dr. Minna Merritts. Off Cardizem. On sotalol. On apixaban.  ? ?Social History: ?Never smoker ? ?Environmental exposures: None ? ?I have personally reviewed patient's past medical/family/social history, allergies, current medications. ? ?Past Medical History:  ?Diagnosis Date  ? Paroxysmal atrial fibrillation (HCC)   ? Thyroid disease   ?  ? ?Family History  ?Problem Relation Age of Onset  ? Breast cancer Neg Hx   ?  ? ?Social History  ? ?Occupational History  ? Not on file  ?Tobacco Use  ? Smoking status: Never  ?  Passive exposure: Never  ? Smokeless tobacco: Never  ?Vaping Use  ? Vaping Use: Never used  ?Substance and Sexual Activity  ? Alcohol use: Yes  ?  Comment: soc  ? Drug use: Not on file  ? Sexual activity: Not on file  ? ? ?No Known Allergies  ? ?Outpatient  Medications Prior to Visit  ?Medication Sig Dispense Refill  ? ELIQUIS 5 MG TABS tablet Take by mouth.    ? levothyroxine (SYNTHROID) 50 MCG tablet Take 50 mcg by mouth daily.    ? magnesium oxide (MAG-OX) 400 MG tablet Take 400 mg by mouth daily.    ? sotalol (BETAPACE) 80 MG tablet Take by mouth.    ? aspirin 81 MG EC tablet Take 81 mg by mouth daily. Swallow whole.    ? diltiazem (CARDIZEM CD) 120 MG 24 hr capsule Take 120 mg by mouth daily. (Patient not taking: Reported on 05/02/2021)    ? levothyroxine (SYNTHROID, LEVOTHROID) 88 MCG tablet Take 88 mcg by mouth daily before breakfast. (Patient not taking: Reported on 05/02/2021)    ? ?No facility-administered medications prior to visit.  ? ? ?Review of Systems  ?Constitutional:  Negative for chills, diaphoresis, fever, malaise/fatigue and weight loss.  ?HENT:  Negative for congestion.   ?Respiratory:  Positive for shortness of breath. Negative for cough, hemoptysis, sputum production and wheezing.   ?Cardiovascular:  Negative for chest pain, palpitations and leg swelling.  ? ? ?Objective:  ? ?Vitals:  ? 05/02/21 1029  ?BP: 112/80  ?Pulse: 62  ?SpO2: 99%  ?Weight: 135 lb 3.2 oz (61.3 kg)  ?Height: 5\' 4"  (1.626 m)  ? ?SpO2: 99 % ?O2 Device: None (Room air) ? ?Physical Exam: ?General: Well-appearing, no acute distress ?HENT: [place], AT ?Eyes: EOMI, no  scleral icterus ?Respiratory: Clear to auscultation bilaterally.  No crackles, wheezing or rales ?Cardiovascular: RRR, -M/R/G, no JVD ?Extremities:-Edema,-tenderness ?Neuro: AAO x4, CNII-XII grossly intact ?Psych: Normal mood, normal affect ? ?Data Reviewed: ? ?Imaging: ?CTA 05/16/15 - No PE. Normal parenchyma. Small bilateral pleural effusions ? ?PFT: ?None on file ? ?Labs: ?CBC ?   ?Component Value Date/Time  ? WBC 9.8 12/23/2020 2343  ? RBC 4.14 12/23/2020 2343  ? HGB 13.6 12/23/2020 2343  ? HCT 39.7 12/23/2020 2343  ? PLT 270 12/23/2020 2343  ? MCV 95.9 12/23/2020 2343  ? MCH 32.9 12/23/2020 2343  ? MCHC 34.3 12/23/2020  2343  ? RDW 12.5 12/23/2020 2343  ? LYMPHSABS 3.0 12/23/2020 2343  ? MONOABS 0.9 12/23/2020 2343  ? EOSABS 0.1 12/23/2020 2343  ? BASOSABS 0.1 12/23/2020 2343  ? ?Absolute eos 12/23/20 -100 ? ?   ?Assessment & Plan:  ? ?Discussion: ?76 year old female never smoker with atrial fibrillation and hypothyroidism who presents as a new patient for evaluation of shortness of breath. Reviewed history. Began two months ago. No associated symptoms. Atypical pattern for obstructive or restrictive lung disease. We discussed work-up including evaluation with PFTs and chest imaging. ? ?Shortness of breath ?--ORDER pulmonary function tests ?--ORDER CT Chest without contrast ?--Encourage regular activity daily  ?--Deep breathing exercises information as attached below ? ?Health Maintenance ?Immunization History  ?Administered Date(s) Administered  ? Influenza Split 10/26/2015  ? Influenza, High Dose Seasonal PF 10/27/2017  ? Influenza,inj,Quad PF,6-35 Mos 10/27/2017  ? Influenza-Unspecified 10/22/2014, 10/26/2015  ? PFIZER(Purple Top)SARS-COV-2 Vaccination 03/09/2019, 04/08/2019  ? Pneumococcal Conjugate-13 10/21/2016  ? Zoster Recombinat (Shingrix) 10/13/2016, 01/25/2017  ? ?Orders Placed This Encounter  ?Procedures  ? CT Chest Wo Contrast  ?  Standing Status:   Future  ?  Standing Expiration Date:   05/03/2022  ?  Order Specific Question:   Preferred imaging location?  ?  Answer:   [place]  ? Pulmonary function test  ?  Standing Status:   Future  ?  Standing Expiration Date:   05/03/2022  ?  Order Specific Question:   Where should this test be performed?  ?  Answer:   [facility]  ?  Order Specific Question:   Full PFT: includes the following: basic spirometry, spirometry pre & post bronchodilator, diffusion capacity (DLCO), lung volumes  ?  Answer:   Full PFT  ?No orders of the defined types were placed in this encounter. ? ? ?No follow-ups on file. After PFTs ? ?I have spent a total time of 45-minutes on the  day of the appointment reviewing prior documentation, coordinating care and discussing medical diagnosis and plan with the patient/family. Imaging, labs and tests included in this note have been reviewed and interpreted independently by me. ? ?Courtenay Creger Rodman Pickle, MD ?[facility] ?05/02/2021 10:37 AM  ?Office Number [phone] ? ? ?

## 2021-05-03 ENCOUNTER — Other Ambulatory Visit (HOSPITAL_COMMUNITY): Payer: Self-pay | Admitting: Family Medicine

## 2021-05-04 ENCOUNTER — Other Ambulatory Visit: Payer: Self-pay | Admitting: Family Medicine

## 2021-05-04 ENCOUNTER — Other Ambulatory Visit (HOSPITAL_COMMUNITY): Payer: Self-pay | Admitting: Family Medicine

## 2021-05-04 DIAGNOSIS — R3129 Other microscopic hematuria: Secondary | ICD-10-CM

## 2021-05-04 DIAGNOSIS — Z1231 Encounter for screening mammogram for malignant neoplasm of breast: Secondary | ICD-10-CM

## 2021-05-05 ENCOUNTER — Other Ambulatory Visit (HOSPITAL_BASED_OUTPATIENT_CLINIC_OR_DEPARTMENT_OTHER): Payer: Self-pay | Admitting: Family Medicine

## 2021-05-05 DIAGNOSIS — Z780 Asymptomatic menopausal state: Secondary | ICD-10-CM

## 2021-05-16 ENCOUNTER — Ambulatory Visit (HOSPITAL_COMMUNITY)
Admission: RE | Admit: 2021-05-16 | Discharge: 2021-05-16 | Disposition: A | Discharge status: Home / Self Care | Payer: Medicare PPO | Source: Ambulatory Visit | Attending: Family Medicine | Admitting: Family Medicine

## 2021-05-16 DIAGNOSIS — R3129 Other microscopic hematuria: Secondary | ICD-10-CM | POA: Diagnosis present

## 2021-05-23 ENCOUNTER — Ambulatory Visit (HOSPITAL_BASED_OUTPATIENT_CLINIC_OR_DEPARTMENT_OTHER)
Admission: RE | Admit: 2021-05-23 | Discharge: 2021-05-23 | Disposition: A | Discharge status: Home / Self Care | Payer: Medicare PPO | Source: Ambulatory Visit | Attending: Family Medicine | Admitting: Family Medicine

## 2021-05-23 DIAGNOSIS — Z780 Asymptomatic menopausal state: Secondary | ICD-10-CM | POA: Insufficient documentation

## 2021-05-30 ENCOUNTER — Ambulatory Visit
Admission: RE | Admit: 2021-05-30 | Discharge: 2021-05-30 | Disposition: A | Discharge status: Home / Self Care | Payer: Medicare PPO | Source: Ambulatory Visit | Attending: Family Medicine | Admitting: Family Medicine

## 2021-05-30 DIAGNOSIS — Z1231 Encounter for screening mammogram for malignant neoplasm of breast: Secondary | ICD-10-CM

## 2021-06-20 ENCOUNTER — Ambulatory Visit (INDEPENDENT_AMBULATORY_CARE_PROVIDER_SITE_OTHER)
Admission: RE | Admit: 2021-06-20 | Discharge: 2021-06-20 | Disposition: A | Discharge status: Home / Self Care | Payer: Medicare PPO | Source: Ambulatory Visit | Attending: Pulmonary Disease | Admitting: Pulmonary Disease

## 2021-06-20 DIAGNOSIS — R0602 Shortness of breath: Secondary | ICD-10-CM | POA: Diagnosis not present

## 2021-06-23 ENCOUNTER — Ambulatory Visit: Payer: Medicare PPO | Admitting: Pulmonary Disease

## 2022-07-24 ENCOUNTER — Other Ambulatory Visit: Payer: Self-pay | Admitting: Family Medicine

## 2022-07-24 DIAGNOSIS — Z1231 Encounter for screening mammogram for malignant neoplasm of breast: Secondary | ICD-10-CM

## 2022-08-01 ENCOUNTER — Ambulatory Visit
Admission: RE | Admit: 2022-08-01 | Discharge: 2022-08-01 | Discharge status: Home / Self Care | Payer: Medicare HMO | Source: Ambulatory Visit | Attending: Family Medicine

## 2022-08-01 DIAGNOSIS — Z1231 Encounter for screening mammogram for malignant neoplasm of breast: Secondary | ICD-10-CM

## 2023-01-01 ENCOUNTER — Other Ambulatory Visit: Payer: Self-pay | Admitting: Family Medicine

## 2023-01-01 DIAGNOSIS — R42 Dizziness and giddiness: Secondary | ICD-10-CM

## 2023-01-11 ENCOUNTER — Other Ambulatory Visit: Payer: Self-pay | Admitting: Family Medicine

## 2023-01-11 DIAGNOSIS — R42 Dizziness and giddiness: Secondary | ICD-10-CM

## 2023-01-20 ENCOUNTER — Other Ambulatory Visit: Payer: Self-pay

## 2023-01-20 ENCOUNTER — Emergency Department (HOSPITAL_BASED_OUTPATIENT_CLINIC_OR_DEPARTMENT_OTHER): Payer: Medicare Other

## 2023-01-20 ENCOUNTER — Encounter (HOSPITAL_BASED_OUTPATIENT_CLINIC_OR_DEPARTMENT_OTHER): Payer: Self-pay | Admitting: Emergency Medicine

## 2023-01-20 ENCOUNTER — Emergency Department (HOSPITAL_BASED_OUTPATIENT_CLINIC_OR_DEPARTMENT_OTHER)
Admission: EM | Admit: 2023-01-20 | Discharge: 2023-01-20 | Disposition: A | Discharge status: Home / Self Care | Payer: Medicare Other | Attending: Emergency Medicine | Admitting: Emergency Medicine

## 2023-01-20 DIAGNOSIS — R0602 Shortness of breath: Secondary | ICD-10-CM | POA: Insufficient documentation

## 2023-01-20 DIAGNOSIS — I480 Paroxysmal atrial fibrillation: Secondary | ICD-10-CM | POA: Insufficient documentation

## 2023-01-20 DIAGNOSIS — Z20822 Contact with and (suspected) exposure to covid-19: Secondary | ICD-10-CM | POA: Diagnosis not present

## 2023-01-20 DIAGNOSIS — R000 Tachycardia, unspecified: Secondary | ICD-10-CM

## 2023-01-20 DIAGNOSIS — Z7982 Long term (current) use of aspirin: Secondary | ICD-10-CM | POA: Insufficient documentation

## 2023-01-20 DIAGNOSIS — Z7901 Long term (current) use of anticoagulants: Secondary | ICD-10-CM | POA: Insufficient documentation

## 2023-01-20 LAB — BASIC METABOLIC PANEL
Anion gap: 9 (ref 5–15)
BUN: 22 mg/dL (ref 8–23)
CO2: 24 mmol/L (ref 22–32)
Calcium: 9.4 mg/dL (ref 8.9–10.3)
Chloride: 105 mmol/L (ref 98–111)
Creatinine, Ser: 1.04 mg/dL — ABNORMAL HIGH (ref 0.44–1.00)
GFR, Estimated: 56 mL/min — ABNORMAL LOW (ref >60)
Glucose, Bld: 169 mg/dL — ABNORMAL HIGH (ref 70–99)
Potassium: 4.1 mmol/L (ref 3.5–5.1)
Sodium: 138 mmol/L (ref 135–145)

## 2023-01-20 LAB — CBC
HCT: 40.1 % (ref 36.0–46.0)
Hemoglobin: 13.3 g/dL (ref 12.0–15.0)
MCH: 32.8 pg (ref 26.0–34.0)
MCHC: 33.2 g/dL (ref 30.0–36.0)
MCV: 99 fL (ref 80.0–100.0)
Platelets: 265 10*3/uL (ref 150–400)
RBC: 4.05 MIL/uL (ref 3.87–5.11)
RDW: 12.3 % (ref 11.5–15.5)
WBC: 7.5 10*3/uL (ref 4.0–10.5)
nRBC: 0 % (ref 0.0–0.2)

## 2023-01-20 LAB — RESP PANEL BY RT-PCR (RSV, FLU A&B, COVID)  RVPGX2
Influenza A by PCR: NEGATIVE
Influenza B by PCR: NEGATIVE
Resp Syncytial Virus by PCR: NEGATIVE
SARS Coronavirus 2 by RT PCR: NEGATIVE

## 2023-01-20 LAB — MAGNESIUM: Magnesium: 1.9 mg/dL (ref 1.7–2.4)

## 2023-01-20 LAB — BRAIN NATRIURETIC PEPTIDE: B Natriuretic Peptide: 606.1 pg/mL — ABNORMAL HIGH (ref 0.0–100.0)

## 2023-01-20 MED ORDER — DILTIAZEM HCL 60 MG PO TABS: 60.0000 mg | ORAL_TABLET | Freq: Three times a day (TID) | ORAL | 0 refills | Status: AC | PRN

## 2023-01-20 MED ORDER — FUROSEMIDE 20 MG PO TABS: 20.0000 mg | ORAL_TABLET | Freq: Every day | ORAL | 0 refills | Status: DC

## 2023-01-20 MED ORDER — SOTALOL HCL 80 MG PO TABS
80.0000 mg | ORAL_TABLET | Freq: Two times a day (BID) | ORAL | Status: DC
Start: 2023-01-20 — End: 2023-01-20

## 2023-01-20 MED ORDER — DILTIAZEM HCL 30 MG PO TABS
30.0000 mg | ORAL_TABLET | Freq: Once | ORAL | Status: AC
  Administered 2023-01-20: 30 mg via ORAL
  Filled 2023-01-20: qty 1

## 2023-01-20 MED ORDER — POTASSIUM CHLORIDE ER 10 MEQ PO TBCR: 10.0000 meq | EXTENDED_RELEASE_TABLET | Freq: Every day | ORAL | 0 refills | Status: AC

## 2023-01-20 NOTE — ED Notes (Signed)
 Pt been having tachycardia and palpitations for "quite a while". Been evaluated by Cards for same, with holter monitor. Here for SOB.

## 2023-01-20 NOTE — ED Triage Notes (Signed)
 Pt c/o elevated HR for a few days; SHOB since this morning; has recently been wearing a monitor for about 10 days in preparation for possible ablation; hx afib

## 2023-01-20 NOTE — Discharge Instructions (Addendum)
 Instructions:  Your workup in the emergency department today showed that your heart rate was faster than normal, and you may have signs of developing congestive heart failure.  This means that your heart may not be pumping efficiently because it is beating so fast.    This condition can cause fluid to build up in your lungs and make you feel short of breath.  This does need very close attention from a cardiologist.  We discussed the option of staying in the hospital, but you did not want to stay in the hospital tonight.  You understand that oral medications at home may not fix this issue.  You understand that your condition may get worse at home.  You also understand that if your heart rate is not better within 24 hours, or if you have worsening shortness of breath, dizziness, or chest pressure, you will need to come back immediately to the emergency department.    Tonight, and for the next several nights until you see your cardiologist, you will increase your sotalol  dosing to 80 mg in the morning and 80 mg at night. Take your eliquis and other typical medications per usual.  I prescribed diltiazem  60 mg as a rescue medication for rapid heart rate.  This means if your heart rate stays above 110 beats per minute at rest, you can take ONE of these tablets every 8 hours, AS NEEDED.  If your heart rate is still above 110 beats per minute despite these medications, you should return to the ER.  Finally, I prescribed you furosemide , or Lasix .  This is a diuretic medicine. This will make you urinate and help get rid of some of the fluid on your lungs.  This may help with your breathing.  Take this once a day in the morning as prescribed for the next 5 days.  You will take this with the potassium supplement, since Lasix  can cause your potassium levels to go low.  Regardless of whether you have improvement or not, you will need to contact your cardiology office urgently and request a follow-up  appointment.

## 2023-01-20 NOTE — ED Notes (Signed)

## 2023-01-20 NOTE — ED Provider Notes (Signed)
 [place] [facility] AT [facility] Provider Note   CSN: [id number] Arrival date & time: 01/20/23  1449     History  Chief Complaint  Patient presents with   Shortness of Breath    Alyssa Wheeler is a 78 y.o. female presenting to the ED with shortness of breath.  Patient has hx of parox A Fib on sotalol  and eliquis, patient reports Alyssa Wheeler has noted that her resting heart rate has been higher than normal for the past 2 weeks.  Typically her resting heart rates around 70 bpm but Alyssa Wheeler said has been 120 up to 140 bpm.  He says normally Alyssa Wheeler can sense when Alyssa Wheeler is in A-fib, but is not having the typical sensation.  Alyssa Wheeler did note some dyspnea on exertion for the past 2 or 3 days, particularly this morning when getting up.  Alyssa Wheeler tells like Alyssa Wheeler feels Alyssa Wheeler cannot take a full breath in.  Alyssa Wheeler denies fevers, chills, coughing.  Alyssa Wheeler reports compliance with all of her medications.  Alyssa Wheeler denies prior history congestive heart failure.  Alyssa Wheeler is wearing a cardiac monitor now for A Fib monitoring.  Cardiologist is at [facility].  Patient denies any leg pain or swelling.  Last cards office note 12/22/22 per my review of external records, Dr Meldon, who notes that the patient has atypical atrial flutter, maintaining sinus rhythm generally with episodes of paroxysmal A-fib, with a typical resting heart rate of 69 bpm.  Alyssa Wheeler is also prescribed 80 mg in the morning and 40 mg at night.  HPI     Home Medications Prior to Admission medications   Medication Sig Start Date End Date Taking? Authorizing Provider  diltiazem  (CARDIZEM ) 60 MG tablet Take 1 tablet (60 mg total) by mouth 3 (three) times daily as needed for up to 21 doses. For persistent resting heart rate above 110 beats per minute 01/20/23  Yes Latiesha Harada, Donnice PARAS, MD  furosemide  (LASIX ) 20 MG tablet Take 1 tablet (20 mg total) by mouth daily for 5 doses. 01/20/23 01/25/23 Yes Tessia Kassin, Donnice PARAS, MD  potassium chloride  (KLOR-CON ) 10 MEQ tablet  Take 1 tablet (10 mEq total) by mouth daily for 5 doses. 01/20/23 01/25/23 Yes Denya Buckingham, Donnice PARAS, MD  aspirin 81 MG EC tablet Take 81 mg by mouth daily. Swallow whole.    [provider]  diltiazem  (CARDIZEM  CD) 120 MG 24 hr capsule Take 120 mg by mouth daily. Patient not taking: Reported on 05/02/2021 11/16/20   [provider]  ELIQUIS 5 MG TABS tablet Take by mouth. 04/09/21   [provider]  levothyroxine (SYNTHROID) 50 MCG tablet Take 50 mcg by mouth daily. 11/14/20   [provider]  levothyroxine (SYNTHROID, LEVOTHROID) 88 MCG tablet Take 88 mcg by mouth daily before breakfast. Patient not taking: Reported on 05/02/2021    [provider]  magnesium oxide (MAG-OX) 400 MG tablet Take 400 mg by mouth daily.    [provider]  sotalol  (BETAPACE ) 80 MG tablet Take by mouth. 04/19/21   [provider]      Allergies    Patient has no known allergies.    Review of Systems   Review of Systems  Physical Exam Updated Vital Signs BP 113/60   Pulse (!) 117   Temp (!) 96.2 F (35.7 C)   Resp (!) 23   Ht 5' 4 (1.626 m)   Wt 63.5 kg   SpO2 93%   BMI 24.03 kg/m  Physical Exam  Constitutional:      General: Alyssa Wheeler is not in acute distress. HENT:     Head: Normocephalic and atraumatic.  Eyes:     Conjunctiva/sclera: Conjunctivae normal.     Pupils: Pupils are equal, round, and reactive to light.  Cardiovascular:     Rate and Rhythm: Regular rhythm. Tachycardia present.  Pulmonary:     Effort: Pulmonary effort is normal. No respiratory distress.  Abdominal:     General: There is no distension.     Tenderness: There is no abdominal tenderness.  Musculoskeletal:     Right lower leg: No edema.     Left lower leg: No edema.  Skin:    General: Skin is warm and dry.  Neurological:     General: No focal deficit present.     Mental Status: Alyssa Wheeler is alert. Mental status is at baseline.  Psychiatric:        Mood and Affect: Mood  normal.        Behavior: Behavior normal.     ED Results / Procedures / Treatments   Labs (all labs ordered are listed, but only abnormal results are displayed) Labs Reviewed  BASIC METABOLIC PANEL - Abnormal; Notable for the following components:      Result Value   Glucose, Bld 169 (*)    Creatinine, Ser 1.04 (*)    GFR, Estimated 56 (*)    All other components within normal limits  BRAIN NATRIURETIC PEPTIDE - Abnormal; Notable for the following components:   B Natriuretic Peptide 606.1 (*)    All other components within normal limits  RESP PANEL BY RT-PCR (RSV, FLU A&B, COVID)  RVPGX2  CBC  MAGNESIUM    EKG EKG Interpretation Date/Time:  Saturday January 20 2023 14:59:05 EST Ventricular Rate:  118 PR Interval:  266 QRS Duration:  71 QT Interval:  318 QTC Calculation: 446 R Axis:   68  Text Interpretation: Sinus or ectopic atrial tachycardia Prolonged PR interval Nonspecific repol abnormality, lateral leads Confirmed by Cottie Cough [phone]) on 01/20/2023 3:01:20 PM  Radiology DG Chest 2 View Result Date: 01/20/2023 CLINICAL DATA:  Short of breath EXAM: CHEST - 2 VIEW COMPARISON:  None Available. FINDINGS: Normal mediastinum and cardiac silhouette. Normal pulmonary vasculature. No evidence of effusion, infiltrate, or pneumothorax. No acute bony abnormality. Electronic recording device in the LEFT chest wall. Bilateral breast prosthetics. IMPRESSION: No acute cardiopulmonary process. Electronically Signed   By: Jackquline Boxer M.D.   On: 01/20/2023 15:52    Procedures Procedures    Medications Ordered in ED Medications  diltiazem  (CARDIZEM ) tablet 30 mg (30 mg Oral Given 01/20/23 1642)    ED Course/ Medical Decision Making/ A&P Clinical Course as of 01/20/23 1800  Sat Jan 20, 2023  1724 HR persistent 118 after diltiazem , we'll try sotalol  at 80 mg  [MT]  1745 Sotalol  not available in the freestanding ED per my discussion with pharmacy, and at this point the  patient is not wanting to stay longer in the ED.  Does not want to be admitted to the hospital.  We discussed my concerns that her heart rate remains elevated which may be triggering congestive heart failure, likely due to ongoing atrial fibrillation.  I would like to better control her heart rate, but Alyssa Wheeler would prefer to attempt a manage this at home and follow-up with her specialist at [facility].  Alyssa Wheeler understands that oral medications alone may not effectively control her heart rate.  But in the interim I will prescribe  her diltiazem  60 mg short acting as a rescue medication and increase her sotalol  evening dose to 80 mg (now 80 mg BID).  Alyssa Wheeler will take her evening dose immediately upon returning home tonight.  I will also start her on 5 days of Lasix  with potassium supplementation for some likely mild pulmonary edema.  Alyssa Wheeler is not hypoxic, her breathing is steady and easy.  Alyssa Wheeler understands that if her heart rate fails to improve by tomorrow Alyssa Wheeler ought to return to the ER [MT]    Clinical Course User Index [MT] Jeree Delcid, Donnice PARAS, MD                                 Medical Decision Making Amount and/or Complexity of Data Reviewed Labs: ordered. Radiology: ordered.  Risk Prescription drug management.   This patient presents to the ED with concern for shortness of breath. This involves an extensive number of treatment options, and is a complaint that carries with it a high risk of complications and morbidity.  The differential diagnosis includes congestive heart failure versus pulmonary edema versus pneumonia versus pleural effusion versus viral URI versus anemia versus other  Co-morbidities that complicate the patient evaluation: History of A-fib at high risk of cardiomyopathy and cardiac complications  External records from outside source obtained and reviewed including cardiology office records as noted above  I ordered and personally interpreted labs.  The pertinent results include:  Electrolytes within normal limits.  No acute anemia.  BNP elevated at 606.  COVID and flu and RSV negative  I ordered imaging studies including x-ray of the chest I independently visualized and interpreted imaging which showed no acute abnormalities I agree with the radiologist interpretation  The patient was maintained on a cardiac monitor.  I personally viewed and interpreted the cardiac monitored which showed an underlying rhythm of: Ectopic atrial rhythm versus sinus tachycardia with heart rate persistently 115-120 bpm.  Per my interpretation the patient's ECG shows ectopic atrial rhythm, no acute ischemic findings, tachycardia  I ordered medication including diltiazem  30 mg for tachycardia  I have reviewed the patients home medicines and have made adjustments as needed  Test Considered: I have a lower suspicion for acute pulmonary embolism, given that the patient is compliant with her Eliquis, has no hypoxia, no other acute risk factors for PE.  I do not believe Alyssa Wheeler was requiring a CT PE study at this time.  After the interventions noted above, I reevaluated the patient and found that they have: stayed the same  Dispostion:  I had an extensive discussion with the patient about concerns for developing congestive heart failure in the setting of persistent A-fib with RVR.  We were not able to rate control her here in the ED.  I explained that her symptoms may progressively worsen over the next several days if her heart rate is not better controlled.  Alyssa Wheeler does not want to stay in the hospital or longer in the ED, citing concerns about icy roads and wanting to see her own specialist at [facility].  Alyssa Wheeler prefers to take her increase sotalol  dosing at home.  Alyssa Wheeler understands that Alyssa Wheeler may need to return to the emergency department tomorrow if her symptoms are still not improving at home.  I detailed discharge plan was provided with some change in her home medications, including restarting diltiazem ,  which Alyssa Wheeler has been on before according to my review of external records.  Short-term  Lasix  was also provided to help with some diuresis.  Alyssa Wheeler will reach out to her cardiology office tomorrow.  Okay for discharge at this time.         Final Clinical Impression(s) / ED Diagnoses Final diagnoses:  Tachycardia  Shortness of breath    Rx / DC Orders ED Discharge Orders          Ordered    furosemide  (LASIX ) 20 MG tablet  Daily        01/20/23 1752    potassium chloride  (KLOR-CON ) 10 MEQ tablet  Daily        01/20/23 1752    diltiazem  (CARDIZEM ) 60 MG tablet  3 times daily PRN        01/20/23 1752              Cottie Donnice PARAS, MD 01/20/23 1800

## 2023-01-22 NOTE — Telephone Encounter (Signed)
 Triage: S: ED follow up.  B: Patient seen in [facility] ED 1/11; patient declined overnight hospital stay.  A: Patient instructed by ED provider to increase sotalol  to 80 mg each morning and each night until seen by cardiologist, diltiazem  60 mg as a rescue medication for rapid heart rate. Patient further advised to come back to ED should her rate become faster as well as s/s CHF, CP/pressure or dizziness.  R: Agree with ED provider.  Patient does not require triage, she requires appt to be seen ASAP.  Patient has a scheduled six month follow up appt 2/10 that can be cancelled when earlier appt made. Referral to scheduling for appt arrangements. Consult with provider's RN to assist by informing scheduling of cancellations/hospitalizations/rescheduled appts

## 2023-01-23 ENCOUNTER — Ambulatory Visit
Admission: RE | Admit: 2023-01-23 | Discharge: 2023-01-23 | Disposition: A | Discharge status: Home / Self Care | Payer: Medicare Other | Source: Ambulatory Visit | Attending: Family Medicine | Admitting: Family Medicine

## 2023-01-23 DIAGNOSIS — R42 Dizziness and giddiness: Secondary | ICD-10-CM

## 2023-01-25 NOTE — Progress Notes (Signed)
 Patient Name: Alyssa Wheeler: [birth year]  MR#: [id number] Primary Care Provider: Rayna Hemming, MD                                  Primary Cardiologist: Meldon   Chief Complaint:  Chief Complaint  Patient presents with  . Follow-up  . Palpitations       ASSESSMENT/ PLAN:   Problem List Items Addressed This Visit     S/P ablation of atrial fibrillation (2017, 2023)   Sinus bradycardia   Paroxysmal atrial fibrillation (HCC) - Primary   Relevant Medications   dilTIAZem  (CARDIZEM  CD) 120 mg 24 hr capsule   Other Relevant Orders   ECG 12 lead (Completed)   Atrial tachycardia with 2:1 conduction    Assessment & Plan (summary):    Ongoing atrial tach vs. Atypical flutter with 2:1 conduction  Add Diltiazem  CD 120 mg for rate control  B/c of prior concerns of sinus brady, I'm reluctant to increase the sotalol .  Continue current dose of sotalol  She and Akbary discussed PFA and she is ready to proceed.   Continue the sotalol  80 mg twice daily ECV tomorrow and will get her on the schedule for pulsed field ablation.  Hopefully should her neck MRI and subsequent therapy is taken care of before such time. Summary of Orders Placed:   Orders Placed This Encounter  Procedures  . ECG 12 lead    HPI:  Alyssa Wheeler  -   78 y.o. female who is well-known to our clinic.  She was recently seen by Dr. Meldon (December 13).  She does have a history of atypical flutter and atrial fibrillation with pulmonary vein isolation 2017 and 2023.  She has been treated with sotalol  80 mg in the a.m. and 40 at night with apixaban 5 mg twice daily.  I saw her in May and at that time they are having some concerns about possible sinus bradycardia but we were able to demonstrate that she had appropriate chronotropic response with current dosing.  Also has a history of hypothyroidism.  At her last visit with Dr. Meldon that he discussed the possibility of PSA but were waiting until she had some  studies performed on her neck (waiting on MRI).   On follow up today, Alyssa Wheeler reports she is frustrated and ready for Definitive care.  She was just to the [facility] emergency department late last week with complaints of shortness of breath and tachycardia.  They gave her some Lasix  and short acting Cardizem .  She states that her heart rate came down appropriately and is gradually crept back up.  The notes suggest that the increase the diltiazem  back up to 80 mg twice daily.  She used the furosemide  1 time and has been reluctant to use it further stating that she does not think that she has any residual excess fluid.  She denies any current orthopnea PND or dependent edema.  Denies abdominal distention.  We discussed her mild weight gain and it seems that that was likely due to indulging in the holiday meals and treats.  She denies any anginal equivalent chest discomfort.  No signs or symptoms of bleeding blood loss or CVA.  Past medical historysignificant for above.     Vitals:   01/25/23 0851  BP: 132/76  Pulse: 110  SpO2: 97%   Physical Exam:   (Vitals above) Largely Unremarkable/unchanged Physical Exam  General: NAD, appropriate affect well appearing. Mildly anxious Heart:RRR  Normal S1, S2  no murmur.  NO gallop, or rub Lungs: CTA Abdomen: Benign            Extremities: No significant edema and no clubbing, cyanosis  Skin: Pink, warm, and dry Neurologic: A&Ox4,  No focal or lateralizing deficits.    Objective Data Reviewed During this Patient Encounter:  See Scanned data/ .pdf for details  LABS:   Lab Results  Component Value Date   MG 1.7 (L) 05/12/2022   TSH 1.190 05/16/2022   BNP 463 (H) 03/06/2021   INR 1.16 03/06/2021   Lab Results  Component Value Date   WBC 5.90 05/16/2022   HGB 13.8 05/16/2022   HCT 40.8 05/16/2022   PLT 235 05/16/2022   MCV 98.3 (H) 05/16/2022   MCH 33.3 (H) 05/16/2022   MCHC 33.9 05/16/2022   RDW 14.0 05/16/2022   Lab  Results  Component Value Date   NA 139 05/12/2022   K 4.4 05/12/2022   CL 105 05/12/2022   CO2 27 05/12/2022   BUN 19 05/12/2022   CREATININE 0.91 05/12/2022   AST 25 05/12/2022   ALT 17 05/12/2022   ALBUMIN 3.9 05/12/2022    Lab Results  Component Value Date   CHOL 201 (H) 12/24/2020   TRIG 52 12/24/2020   HDL 75 12/24/2020   No components found for: EVRL)  Past Medical History,Past Surgical History, Family History, Social History, Medications, Allergies, Social History: As reviewed in EPIC Past Medical History:  Diagnosis Date  . A-fib (CMD)   . Anesthesia complication    hallucinations post ablation  . Anticoagulated 02/16/2016  . Benign neoplasm of colon 02/01/2015  . Benign paroxysmal positional vertigo of left ear 06/27/2017  . Colon polyp   . 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 Heart disease    Afib  . Hematochezia 02/16/2016  . Hemorrhage of anus and rectum 02/01/2015  . History of colon polyps 02/16/2016  . Hypothyroidism   . Near syncope 09/18/2017   Added automatically from request for surgery [id number]  . Neck pain    since recent trip  . Paroxysmal atrial fibrillation (CMD) 11/13/2016  . S/P ablation of atrial fibrillation 11/13/2016  . Sigmoid diverticulosis 03/17/2015  . Sinus bradycardia 11/13/2016    Past Surgical History: Past Surgical History:  Procedure Laterality Date  . BREAST AUGMENTATION Bilateral    Procedure: AUGMENTATION MAMMAPLASTY  . BUNIONECTOMY Right    Procedure: BUNIONECTOMY; AND NERVE REMOVAL  . CARDIAC CATHETERIZATION     Procedure: CARDIAC CATHETERIZATION; NO INTERVENTION  . CARDIAC ELECTROPHYSIOLOGY STUDY AND ABLATION  03/03/2021   Procedure: CARDIAC ELECTROPHYSIOLOGY STUDY AND ABLATION; Dr Meldon  . CESAREAN SECTION, UNSPECIFIED     Procedure: CESAREAN SECTION; X2  . COLONOSCOPY     Procedure: COLONOSCOPY  . CRYOABLATION     Procedure: CRYOABLATION; A-FIB  . OVARIAN CYST REMOVAL     Procedure: OVARIAN CYST REMOVAL     Allergies and Intolerances: No Known Allergies  Current Home Medications: Current Outpatient Medications on File Prior to Visit  Medication Sig Dispense Refill  . dilTIAZem  (CARDIZEM ) 60 mg immediate-release tablet Take 60 mg by mouth 3 (three) times a day.    . Eliquis 5 mg tab TAKE 1 TABLET(5 MG) BY MOUTH TWICE DAILY 60 tablet 6  . furosemide  (LASIX ) 20 mg tablet Take 20 mg by mouth daily.    SABRA levothyroxine (SYNTHROID) 75 mcg tablet TAKE 1 TAB PO QD 6 DAYS PER  WEEK. TAKES MON-SAT. 72 tablet 3  . magnesium oxide 400 mg (241 mg magnesium) tab Take 1 tablet (400 mg total) by mouth 3 (three) times a day for 10 days, THEN 1 tablet (400 mg total) 2 (two) times a day. 180 tablet 3  . sotaloL  (BETAPACE ) 80 mg tablet TAKE 1 TABLET BY MOUTH IN THE MORNING THEN TAKE 1/2 TABLET BY MOUTH AT NIGHT (Patient taking differently: Take 80 mg by mouth 2 (two) times a day.) 135 tablet 3  . potassium chloride  (KLOR-CON ) 10 mEq ER tablet Take 10 mEq by mouth daily. (Patient not taking: Reported on 01/25/2023)     No current facility-administered medications on file prior to visit.    Family History: Family History  Problem Relation Name Age of Onset  . Heart disease Mother    . Heart attack Mother    . Heart attack Father    . Heart disease Father    . Hypertension Sister    . Atrial fibrillation Sister    . Stroke Brother    . Kidney disease Brother    . Liver disease Brother    . Arrhythmia Brother      Social History: Social History   Socioeconomic History  . Marital status: Divorced    Spouse name: Not on file  . Number of children: Not on file  . Years of education: Not on file  . Highest education level: Not on file  Occupational History  . Not on file  Tobacco Use  . Smoking status: Never  . Smokeless tobacco: Never  Substance and Sexual Activity  . Alcohol use: No  . Drug use: No  . Sexual activity: Not on file  Other Topics Concern  . Not on file  Social History  Narrative  . Not on file   Social Drivers of Health   Food Insecurity: Not on file  Transportation Needs: Not on file  Safety: Not on file  Living Situation: Not on file      The above discussed in length with patient, all questions answered. If any changes/questions/concerns patient will call.  I have personally spent >40 minutes involved in face-to-face and non-face-to-face activities for this patient on the day of the visit.  Professional time spent includes chart review and communication with the team involved in the patient's care, in addition to those noted in the documentation. Morene Quan Strag, PA-C, PA-C

## 2023-01-30 ENCOUNTER — Ambulatory Visit
Admission: RE | Admit: 2023-01-30 | Discharge: 2023-01-30 | Disposition: A | Discharge status: Home / Self Care | Payer: Medicare Other | Source: Ambulatory Visit | Attending: Family Medicine | Admitting: Family Medicine

## 2023-01-30 DIAGNOSIS — R42 Dizziness and giddiness: Secondary | ICD-10-CM

## 2023-01-30 MED ORDER — GADOPICLENOL 0.5 MMOL/ML IV SOLN
6.0000 mL | Freq: Once | INTRAVENOUS | Status: AC | PRN
  Administered 2023-01-30: 6 mL via INTRAVENOUS

## 2023-03-15 NOTE — Discharge Summary (Signed)
 Cardiology Discharge Summary  Patient ID: Wheeler, Alyssa [id number]  Admit date: 03/14/2023 Discharge date: 03/15/23  Admitting Physician:  Hildegard Rinks, M.D.  Discharge Physician: Hildegard Rinks, M.D.  Admission Diagnoses: Atrial flutter  Discharge Diagnoses:   1. Atypical atrial flutter (CMD)   2. Paroxysmal atrial fibrillation (CMD)   3. Persistent atrial fibrillation (CMD)   4.      S/P Ablation 5.      Hypothyroidism 6.      EF 65-70%  Discharged Condition: stable  Hospital Course:   Alyssa Wheeler   returns for follow up with past medical history of paroxysmal to persistent atrial fibrillation with syncope.  She has had ablation 2017 with cryoablation in 2023 radiofrequency application.  She has done well but however still occasionally she goes in and out of atrial fibrillation versus atrial flutter.  She is on sotalol  80 in the morning 40 at night but however she cannot tolerate higher dose.  Also advised that she takes magnesium.  Her EKG shows sinus rhythm with rate of 69 bpm.  Also she has issue with her neck they are doing MRI to see why she has a stiff neck.  And so after she did work up and we are going to see her in 6 months if she has recurrent episode then we could proceed with PFA on her but at this point she is maintaining sinus rhythm advise continue same management until we see her back in 6 months.  She was also complained of dizziness will check her blood pressure supine was 125/82 pulse of 69 sitting 133/82 pulse of 67 standing 132/81 and pulse of 70.  Maintaining sinus rhythm with some episodes of paroxysmal atrial fibs or flutter. EKG maintaining sinus rhythm with rate of 69 bpm. She is continue taking apixaban 5 mg twice daily and sotalol  80 mg in the morning 40 mg at night. We discussed with her if she has more episode. In future we can do PFA. But now she has issue with her neck doing MRI to sort of I she has neck pain and then after that if she is continue having this  episode that she is talking about then we can commit her to PFA near future. Patient presents today in a fasting state for redo PVI using PFA and possible atrial flutter.   Alyssa Wheeler was admitted to [facility] and was transferred to the EP lab for atrial flutter ablation, please see Dr Terie procedure note for full details. Post procedure she was transferred to CTU for overnight observation, both groins remained stable throughout her hospitalization. She will be discharged home today and is to follow up in the office as scheduled.   Risk Scores for Thromboembolic Stroke versus Bleeding  in Atrial Fibrilllation       When considering anticoagulation therapy, the risk of thromboembolic stroke should be individualized and balanced against the risk of bleeding. The risk of thromboembolic stroke can be estimated with the CHA2DS2-VASc Score. The risk of major bleeding caused by anticoagulation can be estimated using the HAS-BLED Score.    CHA2DS2-VASc Score (0-9) Age in Years 68+ years old; 2  Sex Female; 1  Congestive Heart Failure History No CHF; 0  Hypertension History No; 0  Stroke/TIA/Thromboembolism History No prior stroke/TIA; 0  Vascular Disease History No; 0  Diabetes Mellitus No; 0  Total CHA2DS2-VASc Score 3 which corresponds to a thromboembolic stroke risk of 4.3% per year (moderate-high risk)    The Total CHA2DS2-VASc  Score estimates the patient's annual risk of embolic stroke or peripheral embolism (BMJ 2011;342:d124.). This can be decreased by approximately 2/3 with systemic anticoagulation with either warfarin or a newer anticoagulant (Apixaban, Dabigatran, Edoxaban, Rivaroxaban) when compared to no therapy and by 50% when compared to aspirin (JAMA 2001;285(22):2864-70. JAMA 2003;290(20):2685-95).      HAS-BLED Score (0-9) Hypertension NO  Renal Disease (Dialysis, Transplant, Cr >2.6 mg/dL) NO  Liver Disease (Cirrhosis, Bilirubin >2x ULN, AST/ALP/AP >3x ULN) NO   Stroke History NO  Prior Major Bleeding or Predisposition to Bleeding NO  Labile INR (Unstable/high INRs, TTR <60%) NO  Age > 65 YES  +1  Medication Usage Predisposing to Bleeding (Antiplatelets, NSAIDs) YES  +1  Alcohol Usage History (8+ drinks/week) NO  Total Score: 2 which corresponds to 3.1     The HAS-BLED score provides an estimate of the annual risk of major bleeding, defined as intracranial bleeding, bleeding requiring hospitalization, HgB decrease of > 20 g/L, or need for transfusion secondary to bleeding (CHEST 2010; 138(5):1093-1100, J Am Coll Cardiol 2011;57:173-80).  Note: the diagnostic performance of HAS-BLED is poor Alyssa Wheeler. 2011 Oct;90(10):1191-200) and using HAS-BLED or any other bleeding risk estimation rule is not superior to using "clinical judgement" to classify patients into low/intermediate/high risk for major bleeding (Am J of Med 2012;125(11):1095-110).   Significant Diagnostic Studies:   Electrophysiology procedure Result Date: 03/14/2023 Images from the original result were not included. Status post redo pulmonary vein ablation and posterior wall ablation, by Medtronic PFA.  For this study we used 3D NavX mapping system for pre and post voltage mapping.  Intracardiac ultrasound was used for monitoring and transseptal performance, for mapping HD grid mapping catheter of Abbott was used.  For PFA we used Medtronic long flex sheath and also pulsed field ablation catheter.  Patient has had CT scan of left atrium for anatomical evaluation and also transesophageal echocardiogram was performed to rule out left atrial appendage thrombus.  At the end electrophysiology was performed no inducible arrhythmia.  Alyssa Wheeler [id number] 03/14/2023 Electrophysiologist Hildegard Rinks, MD  Assistant: Electrophysiology Laboratory Staff. Preoperative Diagnoses: 78 year old Caucasian female with past medical history of hypothyroidism, sinus bradycardia.  She has had pulmonary vein  isolation by cryoablation 2017 and then 2023.  She has had atypical atrial flutter ablation that was left-sided..  We have follow her up and recently she has started having atrial for flutter she was very symptomatic.  Discussion was held with her to do a redo pulmonary vein isolation and posterior wall targeting since she has been having both atrial fibs flutter.  Risk and benefit discussed with her in detail.  Risk including stroke, bleeding, cardiac rupture, DVT and bleeding in groin and other organ damage.  After she understood risk and benefit.  Her anticoagulation was held for 3 doses and also her sotalol  was held for 3 days.  She was brought in electively to proceed with redo pulmonary vein isolation. Detail of procedure: The patient was brought to the EP lab in a post-absorptive and non-sedated state. The nature of the procedure, risks, benefits and alternatives have been discussed with the patient, who agreed to the same. Left and right groins and right internal jugular vein were prepped and draped in a sterile fashion. Then from the femoral vein and a 9 French sheath was placed and also a 7 [place] sheath was placed for right ventricular pacing also,. from right internal jugular vein, a 7 French sheath was placed for coronary sinus, and also the  right atrium, a duo-deca, catheter, recording both right atrium and coronary sinus. Through the left femoral vein, 6 French quadripolar catheter was placed, one in the right ventricular apex. And also through a 9 [place] sheath also a 8 [place] AcuNav  ultrasound catheter was placed for visualization of left atrium and also transseptal performance. Transseptal performed was under ultrasound guidance, pressure monitoring and fluoroscopy. Right femoral vein, initially, cannulated with a short 8 French sheath then exchanged for  transseptal of versa cross [place] frequency needle was used. For mapping and ablation  3D Navx mapping system was used also used high definition  HD grid mapping catheter of Abbott.  TEE was performed a few days before that rule out left atrial appendage thrombus. Anesthesiologist provided intubation and sedation. For details medication Administered referred to anesthesiologist note Also, after termination of procedure, Protamine was given for reversing anticoagulation. Heparin was given during the procedure and ACT was kept above 350. All catheters and sheaths were discontinued after ACT was less than 180 seconds.  Bilateral femoral site were closed by Pro-glide of Abbott. Arrhythmia study: Patient was brought to the cardiac electrophysiology laboratory in fasting nonsedated state.  After anesthesiologist intubated the patient and monitor patient.  Patient was prepped and draped in a sterile manner.  Then standard electrophysiology catheter were placed.  Including coronary sinus, RV apex.  Ultrasound catheter.  The sheath for transseptal.  Patient was in persistent atrial fibrillation with rapid ventricular rate.  At this point.  Heparin was given and ACT was kept above 350 seconds.  At this point we were proceed with transseptal performance.  Transseptal was performed by versa cross [place] radiofrequency needle and sheath and guidewire.  Then after transseptal was performed then transseptal sheath was exchanged with Medtronic long flex sheath for PFA.  After long sheath was placed in at this point we used 3D NavX mapping system in order to make geometry of left atrium and pulmonary vein.  For mapping we used high definition HD grid mapping catheter of Abbott.  After we made left atrium and pulmonary vein geometry.  This showed that right sided pulmonary veins were active.  And also some areas of other veins.  And posterior wall was very active.  At this point after geometry was made for evaluation of mapping.  Then we were ready to proceed with ablation.  We exchanged our high definition HD grid mapping catheter with Medtronic pulsed field ablation  catheter.  And then we proceed initially from right veins.  We start from right superior pulmonary vein isolation by PFA.  Right superior pulmonary vein was isolated with 8 application of PFA.  And then we proceeded with right inferior pulmonary vein.  We could isolate right inferior pulmonary vein by 3 application of PFA.  At this point placed a guidewire in right superior vein and ablate posterior wall close to right veins.  Close to roof and right superior pulmonary vein with 1 application tachycardia terminated.  And we continue ablating posterior wall close to right veins.  And we continue with from  left side posterior wall.  Totally we could isolate posterior wall with 17 application of PFA.  And then also with touch left superior pulmonary vein.  We applied 13 application of PFA for isolating left superior pulmonary vein.  And then we went through the left inferior pulmonary vein.  We could isolated left inferior pulmonary vein by 11 application.  At this point we  finish all 4 pulmonary vein and also  posterior wall.  Was in sinus rhythm.  At this point we remapped pulmonary veins and posterior wall in sinus rhythm.  And it shows that all 4 pulmonary vein and posterior wall are quite.  At this point we did a electrophysiology study.  With 2 cycle length drive and 3 extrastimuli we could not induce any arrhythmia.  And also we did a intracardiac ultrasound there was no pericardial effusion.  Patient hemodynamicallywas stable.  All catheter and sheath was brought to right atrium and ACT was reversed.  When ACT was less than 180 seconds.  Bilateral femoral sites were closed with Pro-glide of Abbott.  And then patient was transferred to [facility] unit in hemodynamically stable condition. Impression: Status post redo pulmonary vein isolation by Medtronic PFA.  Initially voltage mapping was done by 3D NavX mapping system and evaluation showed all 4 pulmonary vein were not isolated although right sided veins were worse  than left.  At this point initially all 4 vein were isolated by PFA.  Then patient went into sinus rhythm after we manage the posterior wall.  Then post ablation of pulmonary vein and posterior wall, we did voltage mapping showed that all 4 pulm veins are isolated and also posterior wall was quite.  Electrophysiology was performed.  We could not induce any arrhythmia. 1.  For this study we used 3D NavX mapping system for pre and post voltage mapping.  Intracardiac ultrasound was used for monitoring and transseptal performance.  Medtronic flex long sheath of PFA was used and also for ablation pulsed field ablation catheter Medtronic was used.,  For mapping we used high definition HD grid mapping catheter of Abbot. 2.  Transseptal was performed under guidance of intracardiac ultrasound, pressure monitoring and fluoroscopy by versa cross [place] radiofrequency needle and sheath and guidewire. Recommendations: After termination of procedure patient transferred to [facility] condition.  Is going to be monitored when he is stable he is going to be transferred to telemetry overnight.  He is going to be started on his medication.  And also his anticoagulation.  And when tomorrow he is going to be evaluated and is stable is going to be discharged home and follow-up as outpatient. Anesthesia: Anesthesiologist provided sedation for this patient, please refer to anesthesiologist note for detail of medication administered.      Complications:  None; patient tolerated the procedure well.        Disposition: PACU - hemodynamically stable        Condition: stable     Disposition: home  Discharge Medications:   Medication List     CONTINUE taking these medications    dilTIAZem  120 mg 24 hr capsule Commonly known as: CARDIZEM  CD Take 1 capsule (120 mg total) by mouth daily.   Eliquis 5 mg Tab Generic drug: apixaban TAKE 1 TABLET(5 MG) BY MOUTH TWICE DAILY   ergocalciferol 1,250 mcg (50,000 unit) capsule Commonly known  as: VITAMIN D2 Take 50,000 Units by mouth once a week. Sundays   furosemide  20 mg tablet Commonly known as: LASIX  Take 20 mg by mouth daily as needed.   levothyroxine 75 mcg tablet Commonly known as: SYNTHROID TAKE 1 TAB PO QD 6 DAYS PER WEEK. TAKES MON-SAT.   magnesium oxide 400 mg (241 mg magnesium) Tab Take 1 tablet (400 mg total) by mouth 3 (three) times a day for 10 days, THEN 1 tablet (400 mg total) 2 (two) times a day. Start taking on: May 15, 2022   potassium chloride  10 mEq ER  tablet Commonly known as: KLOR-CON  Take 10 mEq by mouth daily.   sotaloL  80 mg tablet Commonly known as: BETAPACE  TAKE 1 TABLET BY MOUTH IN THE MORNING THEN TAKE 1/2 TABLET BY MOUTH AT NIGHT       Activity instructions Lifting Limits: Lifting Limits: Do NOT lift more than 10 lbs for: Lifting Limit Duration: 1 week Additional activity instructions Home Care Guide: After Your Electrophysiology Study or Ablation     You may have a small puncture wound in both groins and on the right side of your neck. This is where your doctor put the flexible wires that went to your heart. It is normal to be sore and bruised around these areas.  If you see or feel blood or something wet at the puncture site, call your nurse quickly. Put pressure on the site with your hand and hold until your nurse arrives.  If you need to cough or sneeze, use your hands to put pressure over the puncture sites at that each of the groin and then cough. Do this for several days.  Before you are discharged, you may have an EKG, ultrasound of your heart, and a chest x-ray. This may be done before you leave the EP Lab (electrophysiology lab-where your procedure is done).  Do not lift anything weighing more than a gallon of milk for one week.  You may shower the day after your procedure but do not take a tub bath for 1 week.  You will need to change the band-aids at each groin site daily for 5 days. It is not unusual to have a bruise or  small lump at the puncture site. It will disappear in a week or two. Call your doctor if the lump gets larger.  Please let us  know if you develop a fever. This could be a sign of infection or pneumonia.  It is not uncommon to feel your heart out of rhythm for up to 6 months following your ablation. This is why you will continue on most/all of your previous medications.    Call 911 or your local emergency number:  If you have severe bleeding, a gush of blood, or rapid swelling at the puncture site.  Lie down right away Apply strong pressure to the site with your hand Keep pressure on site until help arrives   **These guidelines are general. Changes may be made due to your special needs. If you have questions about any part of these instructions, please don't hesitate to call the number below. If you need to talk to a nurse, please call the office's main number at [phone].      Diet instructions Cardiac, avoid caffeine       Other instructions Full Code What's Next What's Next Jun  10 Hospital Follow Up Established with Morene Phoenix, PA-C Tuesday Jun 19, 2023 9:15 AM Please arrive 15 minutes prior to your scheduled appointment. [facility]  - Cardiology [facility] [address] [place] [address] [phone]    Time spent on discharge:  10 minutes   Attending Physician Attestation: I personally evaluated and examined the patient, formulated the treatment plan, and reviewed this note with the scribe nurse. I agree with the findings, assessment and plan as documented. Hildegard Rinks, MD, [facility], [facility]

## 2023-10-12 NOTE — Telephone Encounter (Signed)
 TRIAGE  Patient states she feels that she needs to continue Diltiazem  & Sotalol  d/t feels like she's still out of rhythm - HR fluctuating. States she does feel better past few days, no dizziness nor lightheadedness, more energy. Patient recently, wore Zio - awaiting to be read. States suppose to be out of town for next  1 1/2 weeks.  Advised patient that I will consult provider. Advised that I will seek earlier appt if possible. Advised to call clinic if any further questions or concerns. Patient acknowledges in agreement, & verbalizes understanding.

## 2023-10-19 ENCOUNTER — Emergency Department (HOSPITAL_BASED_OUTPATIENT_CLINIC_OR_DEPARTMENT_OTHER)

## 2023-10-19 ENCOUNTER — Encounter (HOSPITAL_BASED_OUTPATIENT_CLINIC_OR_DEPARTMENT_OTHER): Payer: Self-pay | Admitting: Emergency Medicine

## 2023-10-19 ENCOUNTER — Other Ambulatory Visit: Payer: Self-pay

## 2023-10-19 ENCOUNTER — Emergency Department (HOSPITAL_BASED_OUTPATIENT_CLINIC_OR_DEPARTMENT_OTHER): Admission: EM | Admit: 2023-10-19 | Discharge: 2023-10-19 | Disposition: A | Discharge status: Home / Self Care

## 2023-10-19 DIAGNOSIS — Z7982 Long term (current) use of aspirin: Secondary | ICD-10-CM | POA: Insufficient documentation

## 2023-10-19 DIAGNOSIS — Z7901 Long term (current) use of anticoagulants: Secondary | ICD-10-CM | POA: Insufficient documentation

## 2023-10-19 DIAGNOSIS — I4892 Unspecified atrial flutter: Secondary | ICD-10-CM | POA: Diagnosis not present

## 2023-10-19 DIAGNOSIS — E039 Hypothyroidism, unspecified: Secondary | ICD-10-CM | POA: Insufficient documentation

## 2023-10-19 DIAGNOSIS — R002 Palpitations: Secondary | ICD-10-CM | POA: Diagnosis present

## 2023-10-19 DIAGNOSIS — Z79899 Other long term (current) drug therapy: Secondary | ICD-10-CM | POA: Diagnosis not present

## 2023-10-19 LAB — BASIC METABOLIC PANEL WITH GFR
Anion gap: 14 (ref 5–15)
BUN: 17 mg/dL (ref 8–23)
CO2: 22 mmol/L (ref 22–32)
Calcium: 9.4 mg/dL (ref 8.9–10.3)
Chloride: 105 mmol/L (ref 98–111)
Creatinine, Ser: 0.96 mg/dL (ref 0.44–1.00)
GFR, Estimated: >60 mL/min (ref >60)
Glucose, Bld: 139 mg/dL — ABNORMAL HIGH (ref 70–99)
Potassium: 4.1 mmol/L (ref 3.5–5.1)
Sodium: 140 mmol/L (ref 135–145)

## 2023-10-19 LAB — CBC
HCT: 39.1 % (ref 36.0–46.0)
Hemoglobin: 12.8 g/dL (ref 12.0–15.0)
MCH: 32.1 pg (ref 26.0–34.0)
MCHC: 32.7 g/dL (ref 30.0–36.0)
MCV: 98 fL (ref 80.0–100.0)
Platelets: 333 K/uL (ref 150–400)
RBC: 3.99 MIL/uL (ref 3.87–5.11)
RDW: 13.1 % (ref 11.5–15.5)
WBC: 7.1 K/uL (ref 4.0–10.5)
nRBC: 0 % (ref 0.0–0.2)

## 2023-10-19 LAB — TROPONIN T, HIGH SENSITIVITY: Troponin T High Sensitivity: <15 ng/L (ref 0–19)

## 2023-10-19 MED ORDER — DILTIAZEM LOAD VIA INFUSION
10.0000 mg | Freq: Once | INTRAVENOUS | Status: AC
  Administered 2023-10-19: 10 mg via INTRAVENOUS
  Filled 2023-10-19: qty 10

## 2023-10-19 MED ORDER — IOHEXOL 350 MG/ML SOLN
75.0000 mL | Freq: Once | INTRAVENOUS | Status: AC | PRN
  Administered 2023-10-19: 75 mL via INTRAVENOUS

## 2023-10-19 MED ORDER — DILTIAZEM HCL-DEXTROSE 125-5 MG/125ML-% IV SOLN (PREMIX)
5.0000 mg/h | INTRAVENOUS | Status: DC
  Administered 2023-10-19: 5 mg/h via INTRAVENOUS
  Filled 2023-10-19: qty 125

## 2023-10-19 MED ORDER — ETOMIDATE 2 MG/ML IV SOLN
0.1500 mg/kg | Freq: Once | INTRAVENOUS | Status: AC
  Administered 2023-10-19: 9.66 mg via INTRAVENOUS
  Filled 2023-10-19: qty 10

## 2023-10-19 NOTE — Sedation Documentation (Signed)
 Pt. Is of and on alert to voice and SB NSR on monitor.

## 2023-10-19 NOTE — ED Provider Notes (Signed)
 [place] [facility] AT [facility] Provider Note   CSN: [id number] Arrival date & time: 10/19/23  1002     Patient presents with: Atrial Fibrillation   Alyssa Wheeler is a 78 y.o. female.   78 year old female with past medical history of atrial fibrillation and hypothyroidism presenting to the emergency department today with palpitations.  This is apparently been ongoing for the last few weeks to months.  The patient has seen her cardiologist as an outpatient.  She is currently on Cardizem  as well as sotalol .  She has had 3 ablations in the past.  She apparently had some outpatient monitoring as well.  She states that despite taking his medications that her heart rate has remained elevated.  She denies any chest pain or shortness of breath with this.  She called her cardiologist to see if they could get her in sooner but she was told to come to the ER for further evaluation.   Atrial Fibrillation       Prior to Admission medications   Medication Sig Start Date End Date Taking? Authorizing Provider  aspirin 81 MG EC tablet Take 81 mg by mouth daily. Swallow whole.    [provider]  diltiazem  (CARDIZEM  CD) 120 MG 24 hr capsule Take 120 mg by mouth daily. Patient not taking: Reported on 05/02/2021 11/16/20   [provider]  diltiazem  (CARDIZEM ) 60 MG tablet Take 1 tablet (60 mg total) by mouth 3 (three) times daily as needed for up to 21 doses. For persistent resting heart rate above 110 beats per minute 01/20/23   Trifan, Donnice PARAS, MD  ELIQUIS 5 MG TABS tablet Take by mouth. 04/09/21   [provider]  furosemide  (LASIX ) 20 MG tablet Take 1 tablet (20 mg total) by mouth daily for 5 doses. 01/20/23 01/25/23  Cottie Donnice PARAS, MD  levothyroxine (SYNTHROID) 50 MCG tablet Take 50 mcg by mouth daily. 11/14/20   [provider]  levothyroxine (SYNTHROID, LEVOTHROID) 88 MCG tablet Take 88 mcg by mouth daily before breakfast. Patient not  taking: Reported on 05/02/2021    [provider]  magnesium oxide (MAG-OX) 400 MG tablet Take 400 mg by mouth daily.    [provider]  potassium chloride  (KLOR-CON ) 10 MEQ tablet Take 1 tablet (10 mEq total) by mouth daily for 5 doses. 01/20/23 01/25/23  Cottie Donnice PARAS, MD  sotalol  (BETAPACE ) 80 MG tablet Take by mouth. 04/19/21   [provider]    Allergies: Patient has no known allergies.    Review of Systems  Cardiovascular:  Positive for palpitations.  All other systems reviewed and are negative.   Updated Vital Signs BP 110/72   Pulse (!) 57   Temp (!) 97.3 F (36.3 C)   Resp (!) 33   Wt 64.4 kg   SpO2 100%   BMI 24.37 kg/m   Physical Exam Vitals and nursing note reviewed.   Gen: NAD Eyes: PERRL, EOMI HEENT: no oropharyngeal swelling Neck: trachea midline Resp: clear to auscultation bilaterally Card: Tachycardic, no murmurs, rubs, or gallops Abd: nontender, nondistended Extremities: no calf tenderness, no edema Vascular: 2+ radial pulses bilaterally, 2+ DP pulses bilaterally Skin: no rashes Psyc: acting appropriately   (all labs ordered are listed, but only abnormal results are displayed) Labs Reviewed  BASIC METABOLIC PANEL WITH GFR - Abnormal; Notable for the following components:      Result Value   Glucose, Bld 139 (*)    All other components within  normal limits  CBC  TROPONIN T, HIGH SENSITIVITY    EKG: EKG Interpretation Date/Time:  Friday October 19 2023 10:15:16 EDT Ventricular Rate:  125 PR Interval:  99 QRS Duration:  112 QT Interval:  349 QTC Calculation: 504 R Axis:   55  Text Interpretation: Narrow complex tachycardia (flutter vs sinus tachycardia) Borderline intraventricular conduction delay Low voltage, precordial leads Abnormal T, consider ischemia, lateral leads Prolonged QT interval Reconfirmed by Ula Barter [phone]) on 10/19/2023 12:20:15 PM  Radiology: CT Angio Chest PE W and/or Wo Contrast Result  Date: 10/19/2023 CLINICAL DATA:  Chest pain and shortness of breath. EXAM: CT ANGIOGRAPHY CHEST WITH CONTRAST TECHNIQUE: Multidetector CT imaging of the chest was performed using the standard protocol during bolus administration of intravenous contrast. Multiplanar CT image reconstructions and MIPs were obtained to evaluate the vascular anatomy. RADIATION DOSE REDUCTION: This exam was performed according to the departmental dose-optimization program which includes automated exposure control, adjustment of the mA and/or kV according to patient size and/or use of iterative reconstruction technique. CONTRAST:  75mL OMNIPAQUE IOHEXOL 350 MG/ML SOLN COMPARISON:  Kathia 12, 2023 FINDINGS: Cardiovascular: There is mild to moderate severity calcification of the aortic arch. Satisfactory opacification of the pulmonary arteries to the segmental level. No evidence of pulmonary embolism. Normal heart size with moderate severity coronary artery calcification. No pericardial effusion. Mediastinum/Nodes: No enlarged mediastinal, hilar, or axillary lymph nodes. Thyroid gland, trachea, and esophagus demonstrate no significant findings. Lungs/Pleura: Mild atelectatic changes are seen within the bilateral lower lobes. There is no evidence of acute infiltrate. No pleural effusion or pneumothorax. Upper Abdomen: There is a small hiatal hernia. Musculoskeletal: Bilateral calcified breast implants are seen. No acute osseous abnormalities are identified. Review of the MIP images confirms the above findings. IMPRESSION: 1. No evidence of pulmonary embolism or other acute intrathoracic process. 2. Small hiatal hernia. 3. Aortic atherosclerosis. Electronically Signed   By: Suzen Dials M.D.   On: 10/19/2023 12:56   DG Chest Portable 1 View Result Date: 10/19/2023 EXAM: 1 VIEW XRAY OF THE CHEST 10/19/2023 11:52:00 AM COMPARISON: Chest radiographs 01/20/2023 and earlier. CLINICAL HISTORY: 78 year old female. SOB, HR 125, Hx A fib, meds  adjusted, cardiologist recommended ED for EKG, denies chest pain. FINDINGS: LUNGS AND PLEURA: No focal pulmonary opacity. No pulmonary edema. No pleural effusion. No pneumothorax. HEART AND MEDIASTINUM: No acute abnormality of the cardiac and mediastinal silhouettes. BONES AND SOFT TISSUES: Chronic calcified breast implants. No acute osseous abnormality. IMPRESSION: 1. No acute cardiopulmonary abnormality. Electronically signed by: Helayne Hurst MD 10/19/2023 12:14 PM EDT RP Workstation: [id number]     .Cardioversion  Date/Time: 10/19/2023 2:59 PM  Performed by: Ula Barter SAUNDERS, MD Authorized by: Ula Barter SAUNDERS, MD   Consent:    Consent obtained:  Written   Consent given by:  Patient   Risks discussed:  Cutaneous burn, death, induced arrhythmia and pain   Alternatives discussed:  No treatment Pre-procedure details:    Cardioversion basis:  Elective   Rhythm:  Atrial flutter   Electrode placement:  Anterior-posterior Patient sedated: Yes. Refer to sedation procedure documentation for details of sedation.  Attempt one:    Cardioversion mode:  Synchronous   Waveform:  Monophasic   Shock (Joules):  50   Shock outcome:  Conversion to normal sinus rhythm Post-procedure details:    Patient status:  Awake   Patient tolerance of procedure:  Tolerated well, no immediate complications .Sedation  Date/Time: 10/19/2023 3:01 PM  Performed by: Ula Barter SAUNDERS, MD  Authorized by: Ula Prentice SAUNDERS, MD   Consent:    Consent obtained:  Written   Consent given by:  Patient   Risks discussed:  Allergic reaction, dysrhythmia, inadequate sedation, nausea, vomiting, respiratory compromise necessitating ventilatory assistance and intubation and prolonged hypoxia resulting in organ damage Universal protocol:    Immediately prior to procedure, a time out was called: yes   Indications:    Procedure performed:  Cardioversion Pre-sedation assessment:    Time since last food or drink:  0800   ASA classification:  class 2 - patient with mild systemic disease     Mouth opening:  3 or more finger widths   Thyromental distance:  4 finger widths   Mallampati score:  II - soft palate, uvula, fauces visible   Neck mobility: normal     Pre-sedation assessments completed and reviewed: pre-procedure airway patency not reviewed, pre-procedure cardiovascular function not reviewed, pre-procedure hydration status not reviewed, pre-procedure mental status not reviewed, pre-procedure nausea and vomiting status not reviewed, pre-procedure pain level not reviewed, pre-procedure respiratory function not reviewed and pre-procedure temperature not reviewed   A pre-sedation assessment was completed prior to the start of the procedure Immediate pre-procedure details:    Reassessment: Patient reassessed immediately prior to procedure     Reviewed: vital signs   Procedure details (see MAR for exact dosages):    Preoxygenation:  Nasal cannula   Sedation:  Etomidate   Intended level of sedation: deep   Intra-procedure events: hypoxia and respiratory depression     Intra-procedure management:  Airway repositioning   Total Provider sedation time (minutes):  25 Post-procedure details:   A post-sedation assessment was completed following the completion of the procedure.   Attendance: Constant attendance by certified staff until patient recovered     Recovery: Patient returned to pre-procedure baseline     Procedure completion:  Tolerated well, no immediate complications Comments:     The patient did have some mild airway compromise and did have 1 desaturation to 88% which improved with jaw thrust    Medications Ordered in the ED  diltiazem  (CARDIZEM ) 1 mg/mL load via infusion 10 mg (10 mg Intravenous Bolus from Bag 10/19/23 1049)    And  diltiazem  (CARDIZEM ) 125 mg in dextrose  5% 125 mL (1 mg/mL) infusion (0 mg/hr Intravenous Stopped 10/19/23 1408)  iohexol (OMNIPAQUE) 350 MG/ML injection 75 mL (75 mLs Intravenous Contrast  Given 10/19/23 1218)  etomidate (AMIDATE) injection 9.66 mg (9.66 mg Intravenous Given 10/19/23 1417)                                    Medical Decision Making 78 year old female with past medical history of atrial fibrillation and hyperlipidemia presenting to the emergency department today with persistent atrial fibrillation with RVR.  Patient is not have any obvious signs of heart failure here on exam.  Pulse ox is 99%.  Will start the patient on Cardizem  bolus and infusion as long as her blood pressure tolerates.  I will obtain basic labs here to eval for electrolyte abnormalities.  Will obtain an x-ray here as well.  I will call discuss her case with her cardiologist at [facility] as this does seem to be more of a chronic issue for the patient at this point and unclear if cardioversion but actually help her since she has essentially been in persistent atrial fibrillation for quite some time.  Suspicion for pulmonary embolism is  low at this time as patient is anticoagulated.  Initially I discussed the patient's case with her cardiologist at [facility] to see if they would like to admit the patient.  They recommended increasing her Cardizem  dose and to send her home with a heart rate in the 120s to 130s.  I did not think that this was the best thing for the patient.  I called and discussed her case with Dr. Vernice who is our cardiologist on-call.  Since patient has not missed any doses of her anticoagulation we feel that cardioversion would be reasonable and warranted.  I discussed these 2 options with the patient including risks and benefits of each.  The patient did decide that she would like to go through with cardioversion.  This was performed here.  The patient tolerated this well without any complications.  She did have a long sinus pause after cardioversion but did go into a sinus rhythm and remained in a sinus rhythm.  She is still somewhat groggy at the time of signout plan is for  discharge with cardiology follow-up.  CRITICAL CARE Performed by: Prentice JONELLE Medicus   Total critical care time: 40 minutes  Critical care time was exclusive of separately billable procedures and treating other patients.  Critical care was necessary to treat or prevent imminent or life-threatening deterioration.  Critical care was time spent personally by me on the following activities: development of treatment plan with patient and/or surrogate as well as nursing, discussions with consultants, evaluation of patient's response to treatment, examination of patient, obtaining history from patient or surrogate, ordering and performing treatments and interventions, ordering and review of laboratory studies, ordering and review of radiographic studies, pulse oximetry and re-evaluation of patient's condition.     Amount and/or Complexity of Data Reviewed Labs: ordered. Radiology: ordered.  Risk Prescription drug management.        Final diagnoses:  Atrial flutter with rapid ventricular response [facility])    ED Discharge Orders     None          Medicus Prentice JONELLE, MD 10/19/23 1505

## 2023-10-19 NOTE — Sedation Documentation (Signed)
 Vital signs stable.

## 2023-10-19 NOTE — Sedation Documentation (Signed)
 Pt. Had long pause with cardioversion and then into SR.   Pt. Still sleeping.  EDP at bedside with Resp. Therapist.  Pt. Isstill resting with eyes closed.

## 2023-10-19 NOTE — Discharge Instructions (Addendum)
 Please follow-up with your cardiologist for reevaluation.  Return to the emergency department for worsening symptoms.

## 2023-10-19 NOTE — ED Notes (Addendum)
 Respiratory Therapist at [facility]  in room number 2 during procedure.  Suction with [place] at [facility] set up and ready to use. Ambu bag at [facility] and ready to use.  Patient placed on ETCO2 Nasal Cannula at 2 LPM.  Vitals at conclusion of procedure:  ETCO2 30 mmHg HR 52  RR 18  SPO2 100  Patient awake and able to verbalize name.

## 2023-10-19 NOTE — ED Notes (Addendum)
 In to see Pt. And EDP in room at same time speaking with Cardiology.  At this time we will not increase the Cardizem  per EDP and Pt. Will go for a CT scan.  Pt. At present time is in no distress.

## 2023-10-19 NOTE — Sedation Documentation (Signed)
 ED Provider at bedside.

## 2023-10-19 NOTE — Sedation Documentation (Addendum)
 Pt. Was shocked with Sync button on 50 Js  after the meds was pushed IV. Pt. Converted to SB. Aldrete 7 and Pt. Responding to voice.(Late Entry)

## 2023-10-19 NOTE — Sedation Documentation (Signed)
 Pt. Cardiac rhythm is A fib RVR with no Pain noted.

## 2023-10-19 NOTE — ED Triage Notes (Signed)
 Pt reports her HR was 125 this morning , Hx A fib , had her meds adjusted recently .  Told by cardiologist  to come to ED for EKG .  Denies chest pain or shortness of breath .

## 2023-10-19 NOTE — Sedation Documentation (Signed)
 Patient is resting comfortably.

## 2023-10-19 NOTE — ED Provider Notes (Signed)
 Received patient in turnover from Dr. Ula.  Please see their note for further details of Hx, PE.  Briefly patient is a 78 y.o. female with a Atrial Fibrillation .  Patient in a flutter, cardioverted.  Driving home, obs until improvement.  Patient feeling better but not well enough to drive a vehicle.  Called her son to drive her home.    Emil Share, DO 10/19/23 [phone]

## 2023-10-19 NOTE — ED Notes (Signed)
 Pt ambulated with steady gait, o2 and HR monitored. Pt denies shob or dizziness, no s/s of distress. Beginning HR 64, O2 94%. Pt HR after walk, 84, O2 97%

## 2023-10-19 NOTE — ED Notes (Signed)
 5.17 mL of etomidate wasted in med room w Quintin RN witness

## 2023-10-19 NOTE — Sedation Documentation (Signed)
 Pt. Is LOC Alert and Oriented

## 2023-10-20 IMAGING — MG DIGITAL SCREENING BREAST BILAT IMPLANT W/ TOMO W/ CAD
8 of 12 series · 8 of 28 positions shown · non-contrast
Comparison: Previous exam(s).

CLINICAL DATA: Screening.

EXAM:
DIGITAL SCREENING BILATERAL MAMMOGRAM WITH IMPLANTS, CAD AND
TOMOSYNTHESIS
TECHNIQUE: Bilateral screening digital craniocaudal and mediolateral oblique
mammograms were obtained. Bilateral screening digital breast
tomosynthesis was performed. The images were evaluated with
computer-aided detection. Standard and/or implant displaced views
were performed.

[R CC]
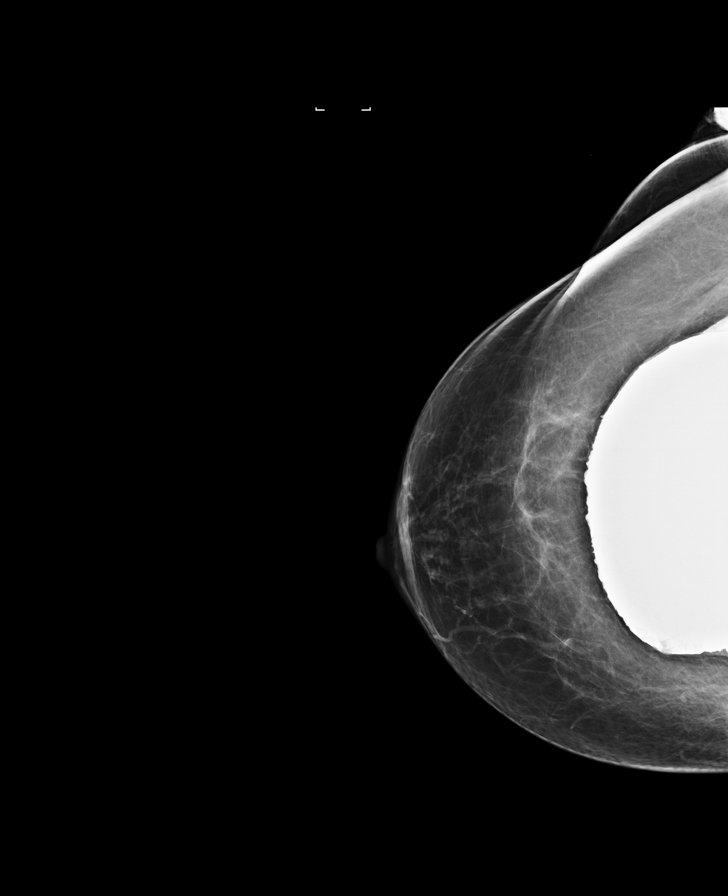

[R MLO]
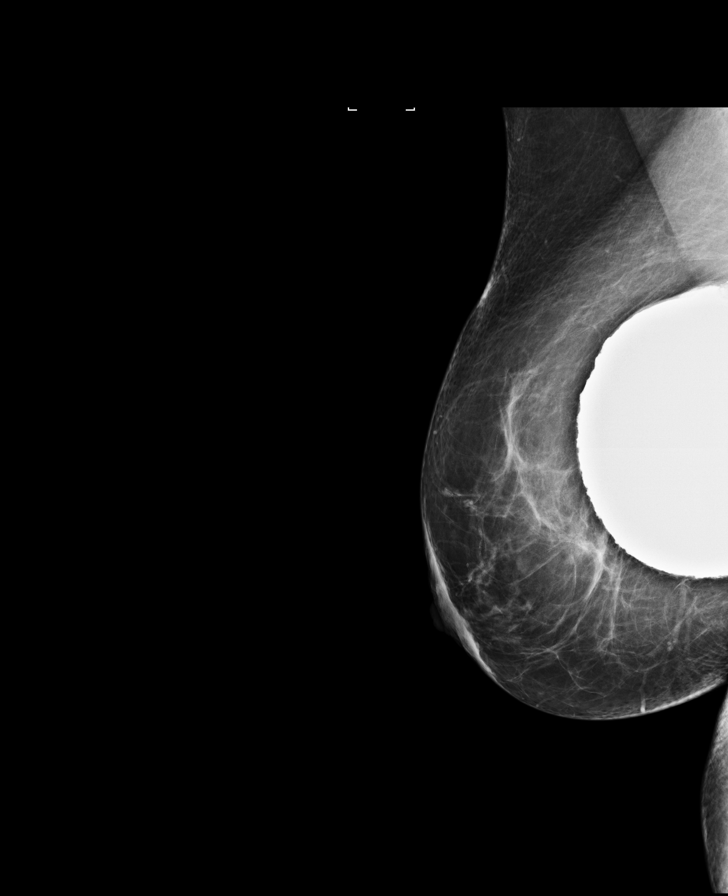

[L MLO]
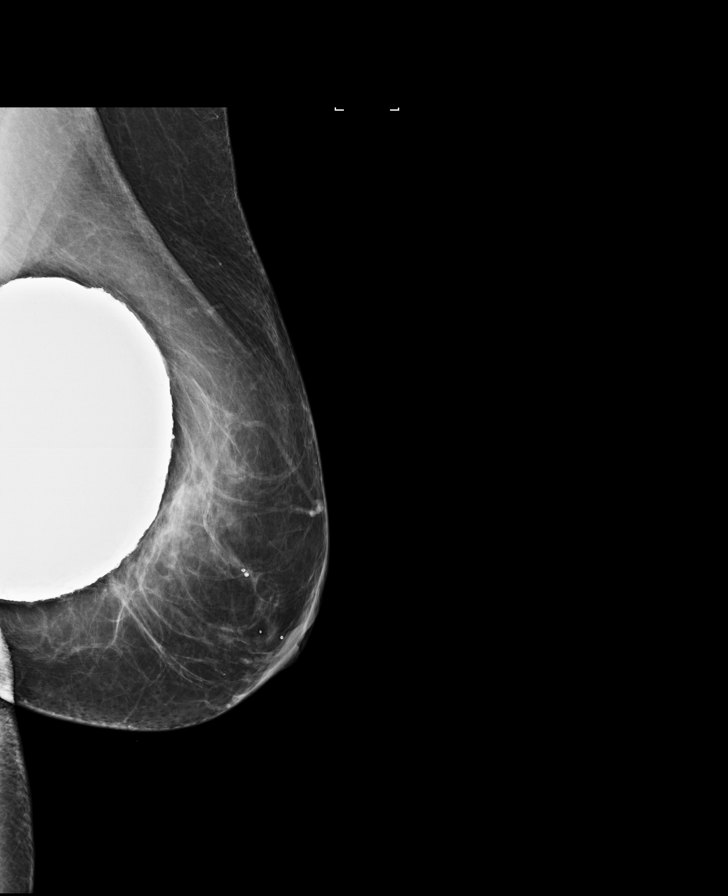

[L CC]
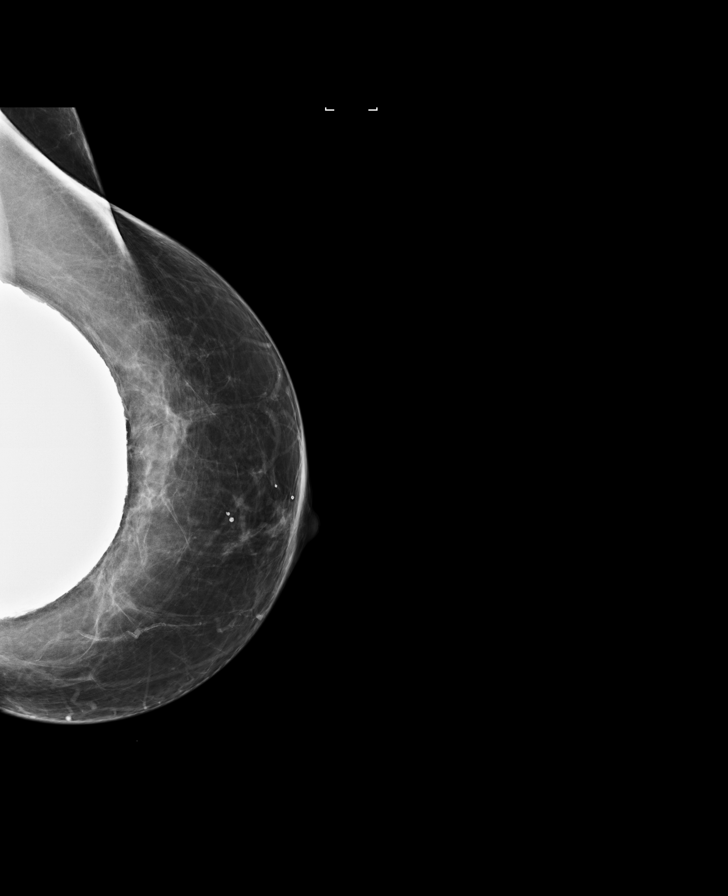

[L CC synth-2D]
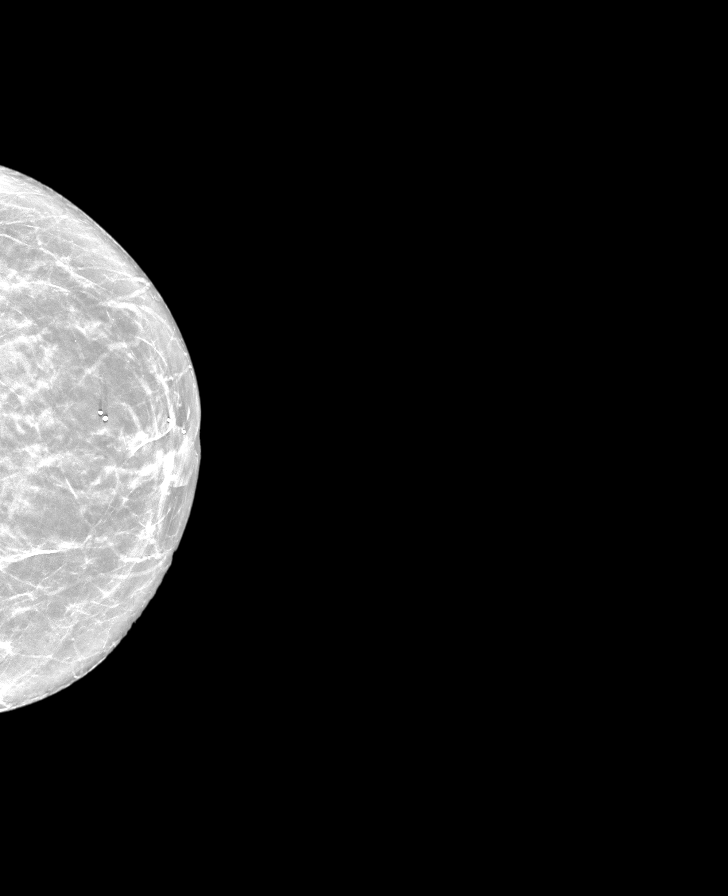

[L MLO synth-2D]
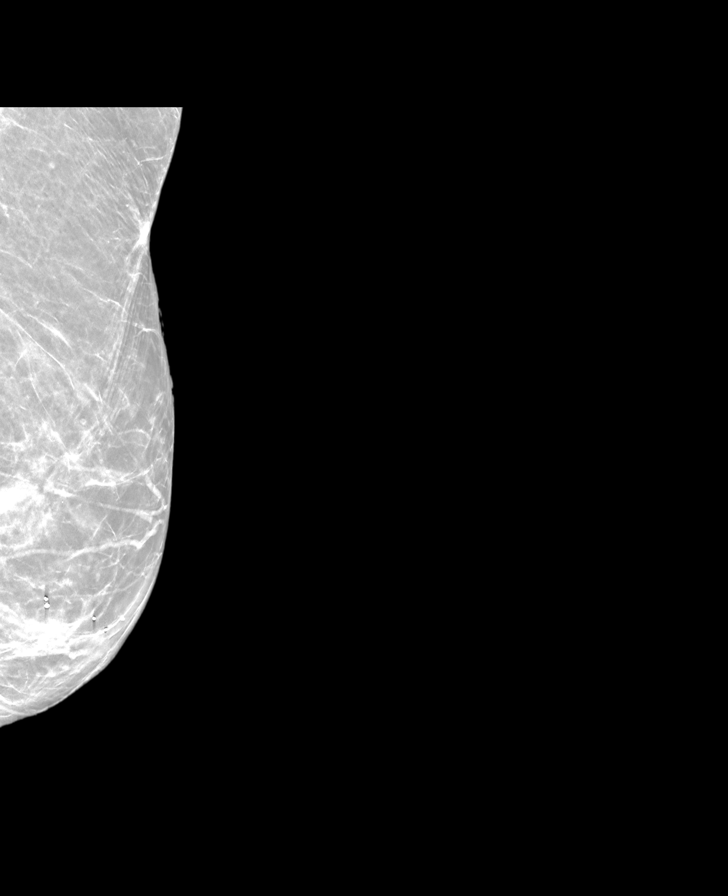

[R CC synth-2D]
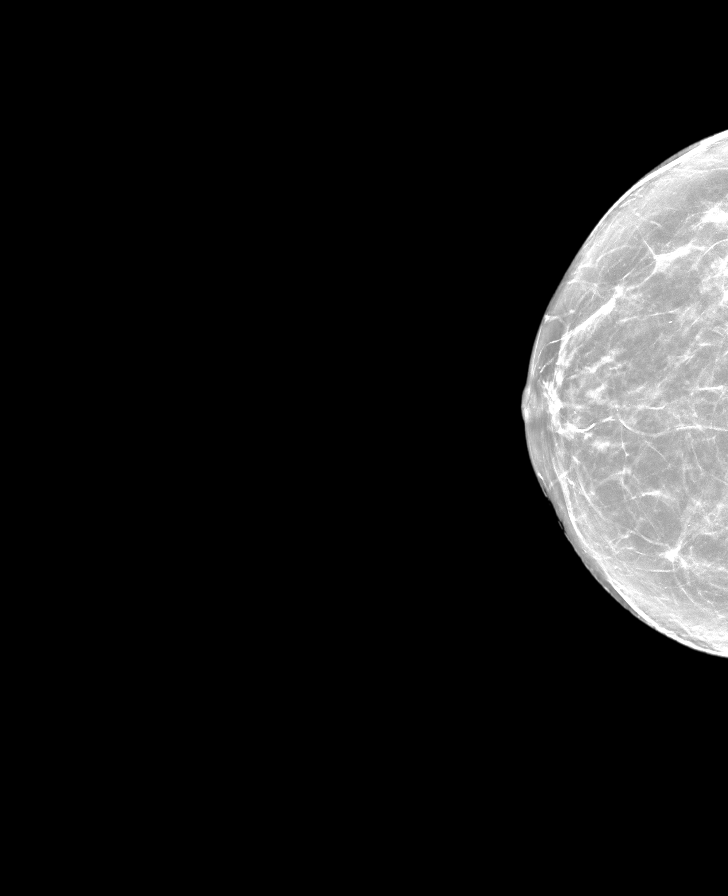

[R MLO synth-2D]
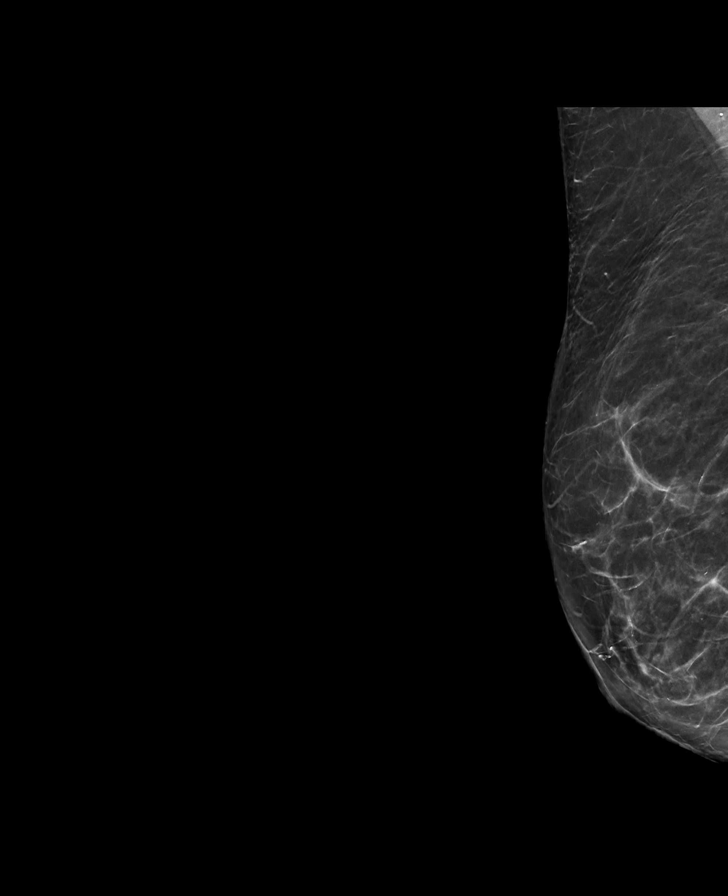

[8 of 28 positions shown; findings below may reference images not displayed]

ACR Breast Density Category b: There are scattered areas of
fibroglandular density.
FINDINGS: The patient has prepectoral implants. There are no findings
suspicious for malignancy.
IMPRESSION: No mammographic evidence of malignancy. A result letter of this
screening mammogram will be mailed directly to the patient.

RECOMMENDATION:
Screening mammogram in one year. (Code:TC-L-OXK)

BI-RADS CATEGORY  1:  Negative.

## 2023-11-10 IMAGING — CT CT CHEST W/O CM
2 of 4 series · 15 of 36 positions shown, 18 images · non-contrast
Comparison: Chest CT 05/16/2015.

CLINICAL DATA: Shortness of breath.



[Series 2: thorax · axial · 0.66mm/px · z∈[-270,-12]mm · 12 of 153 slices shown, 15 images]
[im 12/153  mediastinal]
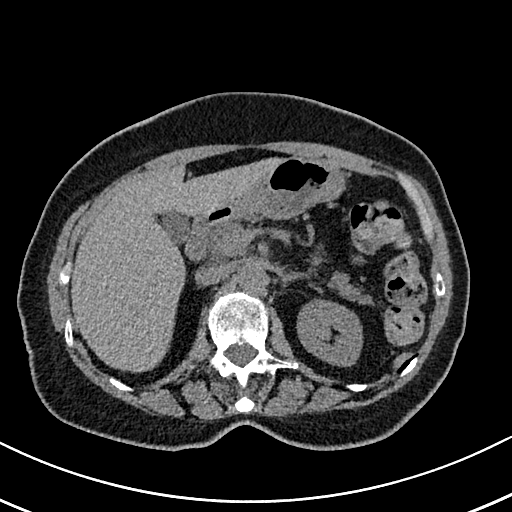
[im 12/153  lung]
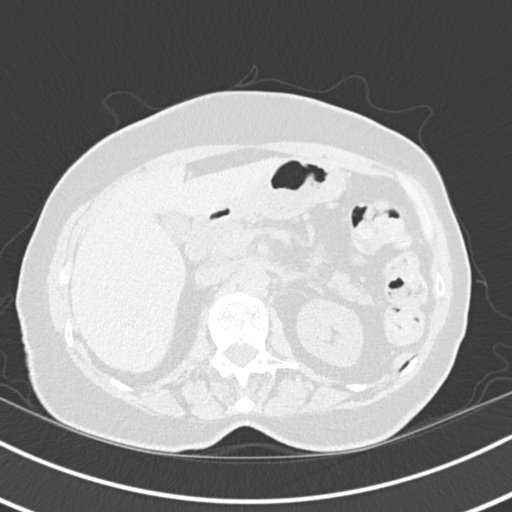
[im 24/153  lung]
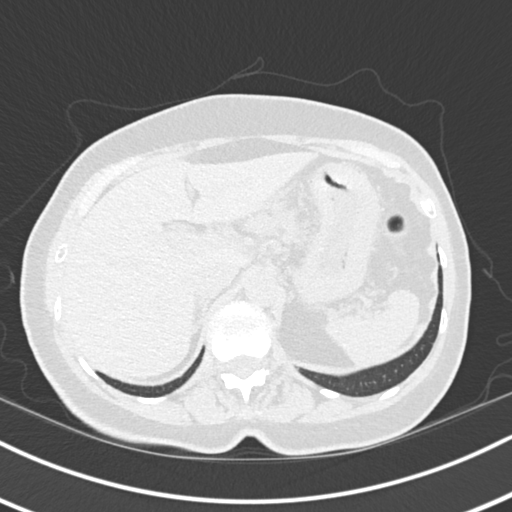
[im 36/153  lung]
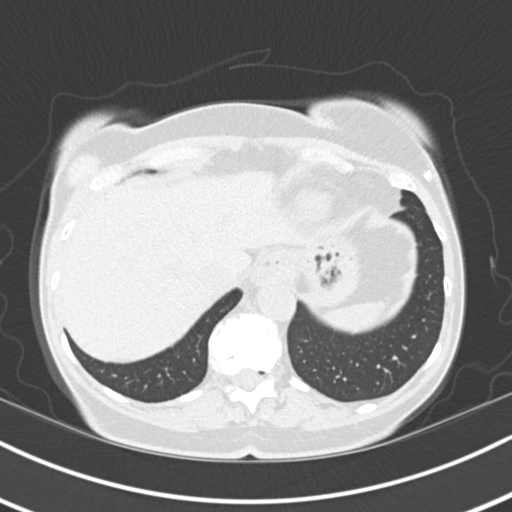
[im 47/153  lung]
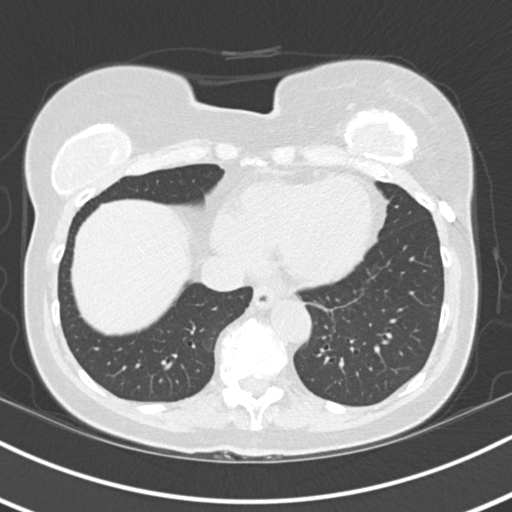
[im 59/153  mediastinal]
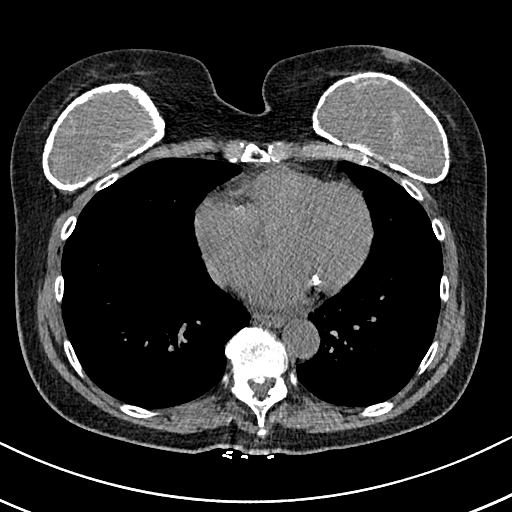
[im 59/153  lung]
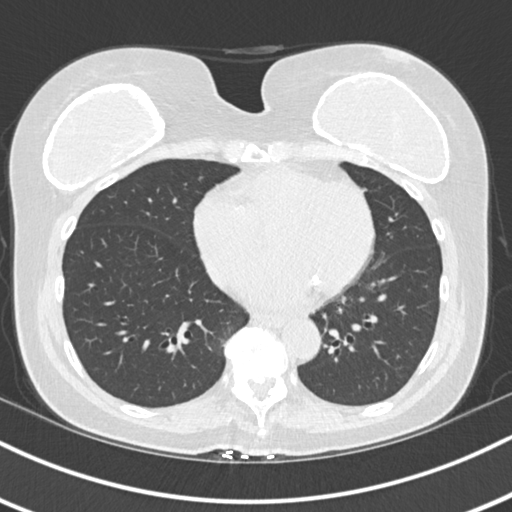
[im 71/153  lung]
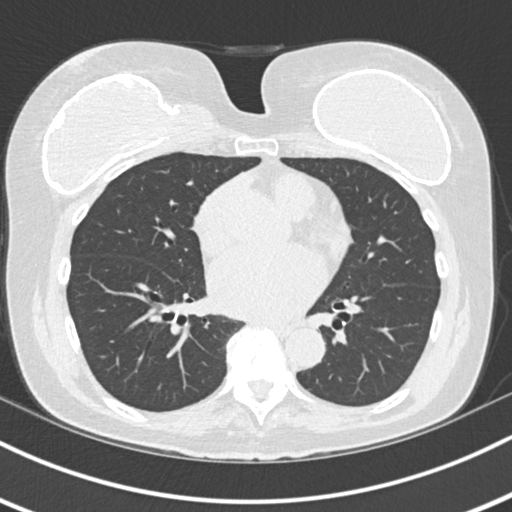
[im 82/153  lung]
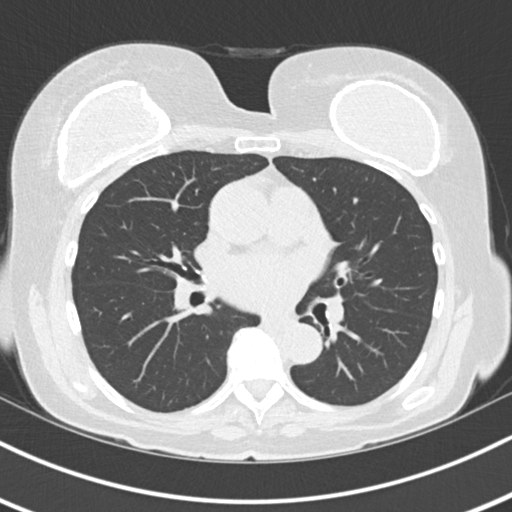
[im 94/153  lung]
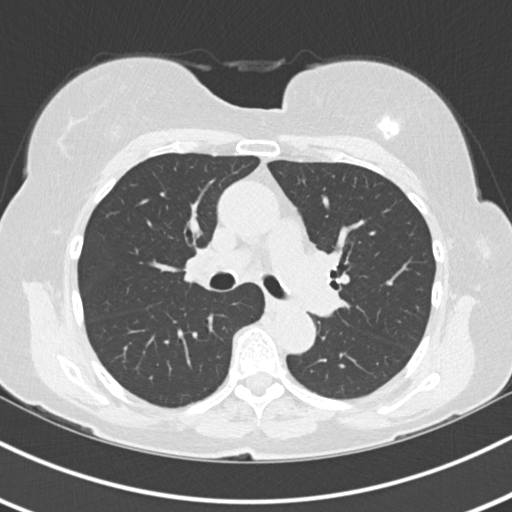
[im 106/153  mediastinal]
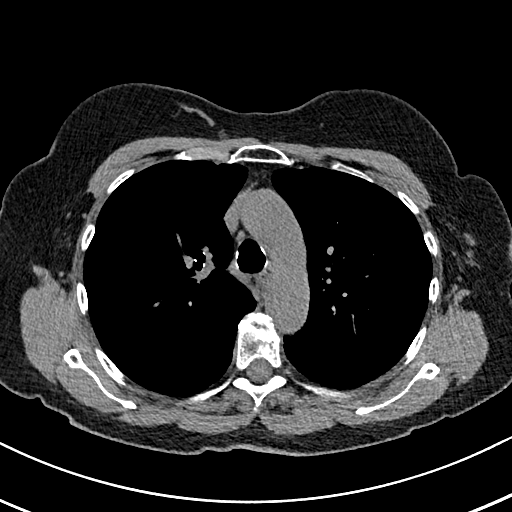
[im 106/153  lung]
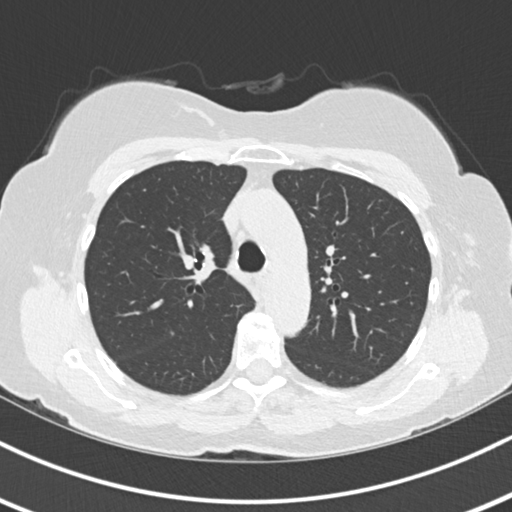
[im 117/153  lung]
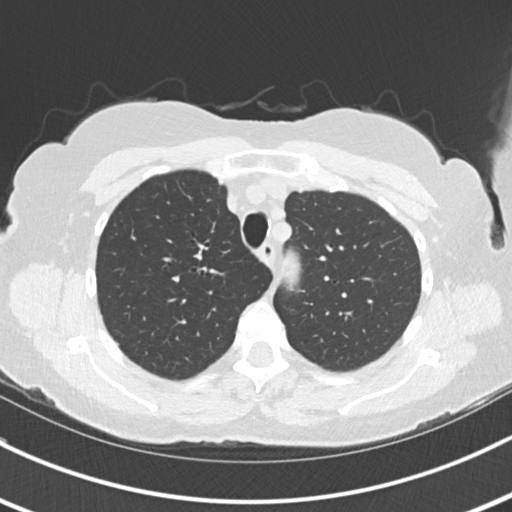
[im 129/153  lung]
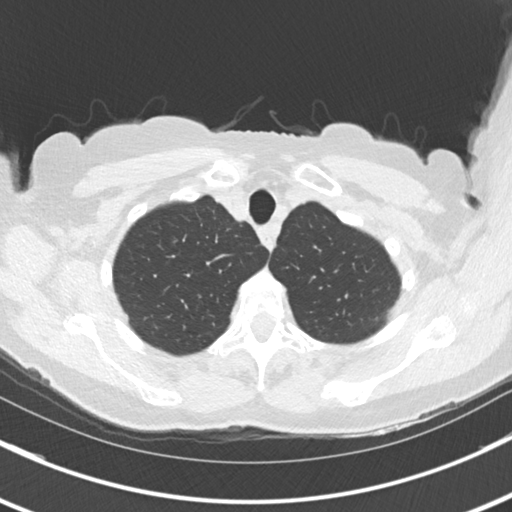
[im 141/153  lung]
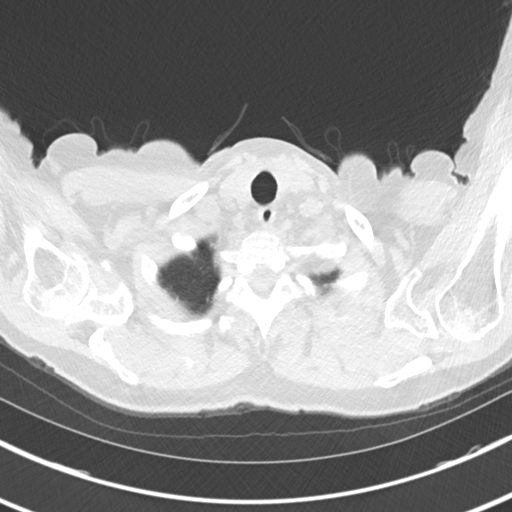

[Series 5: coronal · coronal · 0.59mm/px · 3 of 113 slices shown]
[im 23/113  lung]
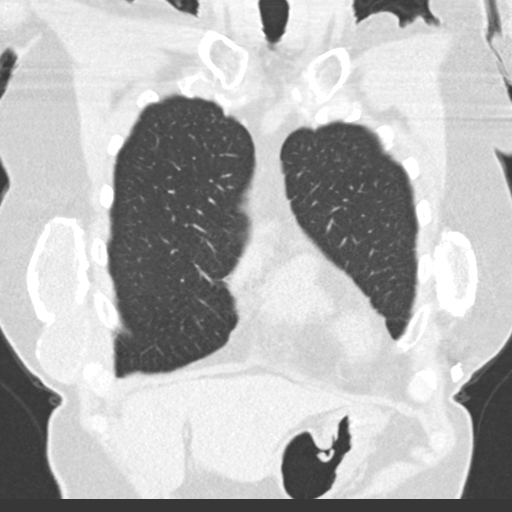
[im 45/113  lung]
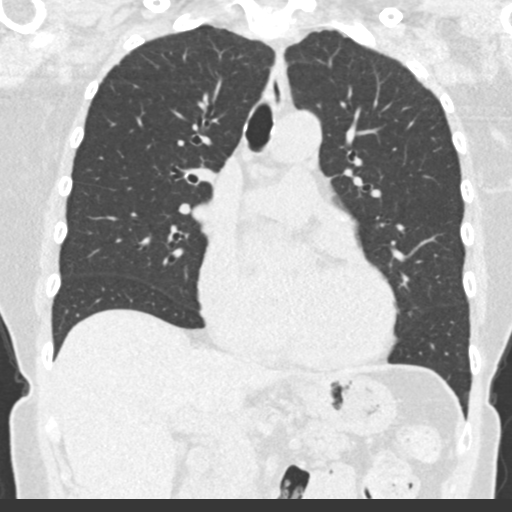
[im 68/113  lung]
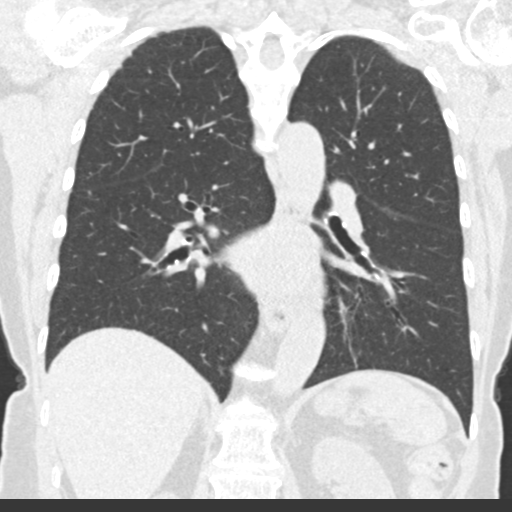

[15 of 36 positions shown; findings below may reference images not displayed]

FINDINGS: Cardiovascular: No significant vascular findings. Normal heart size.
No pericardial effusion. There are atherosclerotic calcifications of
the aorta and coronary arteries.

Mediastinum/Nodes: No enlarged mediastinal or axillary lymph nodes.
Thyroid gland, trachea, and esophagus demonstrate no significant
findings.

Lungs/Pleura: Lungs are clear. No pleural effusion or pneumothorax.

Upper Abdomen: No acute abnormality. There is a punctate
calcification in the left kidney.

Musculoskeletal: Again seen are peripherally calcified bilateral
breast implants. These appear unchanged from the prior study. New
left chest wall loop recorder device present. Multilevel
degenerative changes affect the spine.
IMPRESSION: 1. No acute cardiopulmonary process.
2.  Aortic Atherosclerosis ([id number]).

## 2023-12-08 ENCOUNTER — Emergency Department (HOSPITAL_BASED_OUTPATIENT_CLINIC_OR_DEPARTMENT_OTHER)

## 2023-12-08 ENCOUNTER — Emergency Department (HOSPITAL_BASED_OUTPATIENT_CLINIC_OR_DEPARTMENT_OTHER)
Admission: EM | Admit: 2023-12-08 | Discharge: 2023-12-08 | Disposition: A | Discharge status: Home / Self Care | Attending: Emergency Medicine | Admitting: Emergency Medicine

## 2023-12-08 ENCOUNTER — Encounter (HOSPITAL_BASED_OUTPATIENT_CLINIC_OR_DEPARTMENT_OTHER): Payer: Self-pay

## 2023-12-08 DIAGNOSIS — E039 Hypothyroidism, unspecified: Secondary | ICD-10-CM | POA: Diagnosis not present

## 2023-12-08 DIAGNOSIS — R7989 Other specified abnormal findings of blood chemistry: Secondary | ICD-10-CM | POA: Insufficient documentation

## 2023-12-08 DIAGNOSIS — I484 Atypical atrial flutter: Secondary | ICD-10-CM | POA: Insufficient documentation

## 2023-12-08 DIAGNOSIS — I480 Paroxysmal atrial fibrillation: Secondary | ICD-10-CM | POA: Insufficient documentation

## 2023-12-08 DIAGNOSIS — Z7982 Long term (current) use of aspirin: Secondary | ICD-10-CM | POA: Insufficient documentation

## 2023-12-08 DIAGNOSIS — R000 Tachycardia, unspecified: Secondary | ICD-10-CM | POA: Diagnosis present

## 2023-12-08 DIAGNOSIS — R002 Palpitations: Secondary | ICD-10-CM | POA: Diagnosis not present

## 2023-12-08 DIAGNOSIS — Z79890 Hormone replacement therapy (postmenopausal): Secondary | ICD-10-CM | POA: Diagnosis not present

## 2023-12-08 DIAGNOSIS — Z7901 Long term (current) use of anticoagulants: Secondary | ICD-10-CM | POA: Diagnosis not present

## 2023-12-08 DIAGNOSIS — I4892 Unspecified atrial flutter: Secondary | ICD-10-CM | POA: Insufficient documentation

## 2023-12-08 LAB — CBC WITH DIFFERENTIAL/PLATELET
Abs Immature Granulocytes: 0.03 K/uL (ref 0.00–0.07)
Basophils Absolute: 0.1 K/uL (ref 0.0–0.1)
Basophils Relative: 1 %
Eosinophils Absolute: 0.2 K/uL (ref 0.0–0.5)
Eosinophils Relative: 2 %
HCT: 40.5 % (ref 36.0–46.0)
Hemoglobin: 13.2 g/dL (ref 12.0–15.0)
Immature Granulocytes: 0 %
Lymphocytes Relative: 29 %
Lymphs Abs: 2.6 K/uL (ref 0.7–4.0)
MCH: 31.5 pg (ref 26.0–34.0)
MCHC: 32.6 g/dL (ref 30.0–36.0)
MCV: 96.7 fL (ref 80.0–100.0)
Monocytes Absolute: 0.9 K/uL (ref 0.1–1.0)
Monocytes Relative: 10 %
Neutro Abs: 5.1 K/uL (ref 1.7–7.7)
Neutrophils Relative %: 58 %
Platelets: 271 K/uL (ref 150–400)
RBC: 4.19 MIL/uL (ref 3.87–5.11)
RDW: 13.6 % (ref 11.5–15.5)
WBC: 8.9 K/uL (ref 4.0–10.5)
nRBC: 0 % (ref 0.0–0.2)

## 2023-12-08 LAB — TROPONIN T, HIGH SENSITIVITY
Troponin T High Sensitivity: 21 ng/L — ABNORMAL HIGH (ref 0–19)
Troponin T High Sensitivity: 16 ng/L (ref 0–19)

## 2023-12-08 LAB — COMPREHENSIVE METABOLIC PANEL WITH GFR
ALT: 47 U/L — ABNORMAL HIGH (ref 0–44)
AST: 59 U/L — ABNORMAL HIGH (ref 15–41)
Albumin: 4.1 g/dL (ref 3.5–5.0)
Alkaline Phosphatase: 88 U/L (ref 38–126)
Anion gap: 12 (ref 5–15)
BUN: 25 mg/dL — ABNORMAL HIGH (ref 8–23)
CO2: 25 mmol/L (ref 22–32)
Calcium: 9.5 mg/dL (ref 8.9–10.3)
Chloride: 103 mmol/L (ref 98–111)
Creatinine, Ser: 1.15 mg/dL — ABNORMAL HIGH (ref 0.44–1.00)
GFR, Estimated: 49 mL/min — ABNORMAL LOW (ref >60)
Glucose, Bld: 142 mg/dL — ABNORMAL HIGH (ref 70–99)
Potassium: 4.5 mmol/L (ref 3.5–5.1)
Sodium: 140 mmol/L (ref 135–145)
Total Bilirubin: 0.9 mg/dL (ref 0.0–1.2)
Total Protein: 7.4 g/dL (ref 6.5–8.1)

## 2023-12-08 LAB — MAGNESIUM: Magnesium: 1.8 mg/dL (ref 1.7–2.4)

## 2023-12-08 LAB — PRO BRAIN NATRIURETIC PEPTIDE: Pro Brain Natriuretic Peptide: 8243 pg/mL — ABNORMAL HIGH (ref <300.0)

## 2023-12-08 LAB — TSH: TSH: 2.84 u[IU]/mL (ref 0.350–4.500)

## 2023-12-08 LAB — T4, FREE: Free T4: 1.1 ng/dL (ref 0.61–1.12)

## 2023-12-08 MED ORDER — DILTIAZEM HCL 25 MG/5ML IV SOLN
20.0000 mg | Freq: Once | INTRAVENOUS | Status: AC
  Administered 2023-12-08: 20 mg via INTRAVENOUS
  Filled 2023-12-08: qty 5

## 2023-12-08 MED ORDER — FUROSEMIDE 20 MG PO TABS: 20.0000 mg | ORAL_TABLET | Freq: Every day | ORAL | 0 refills | Status: AC

## 2023-12-08 MED ORDER — SODIUM CHLORIDE 0.9 % IV BOLUS
1000.0000 mL | Freq: Once | INTRAVENOUS | Status: AC
  Administered 2023-12-08: 1000 mL via INTRAVENOUS

## 2023-12-08 NOTE — ED Notes (Signed)
 ED Provider at bedside.

## 2023-12-08 NOTE — ED Notes (Signed)
Urine specimen in lab 

## 2023-12-08 NOTE — ED Notes (Signed)
 NS notified MD Elnor of HR 37, MD acknowledged

## 2023-12-08 NOTE — Discharge Instructions (Addendum)
 It was a pleasure caring for you today in the emergency department.  Please follow-up with your cardiologist in the office at your upcoming appointment  Can take a second dose of your metoprolol  if your heart rate becomes acutely elevated again  Please return to the emergency department for any worsening or worrisome symptoms.

## 2023-12-08 NOTE — ED Provider Notes (Addendum)
 [facility] Provider Note  CSN: [id number] Arrival date & time: 12/08/23 0800  Chief Complaint(s) Tachycardia  HPI Alyssa Wheeler is a 78 y.o. female with past medical history as below, significant for paroxysmal atrial fibrillation, atypical atrial flutter, sinus node dysfunction, hypothyroid who presents to the ED with complaint of elevated HR  She follows with cardiology [facility] Dr Linus. Stopped seeing Dr Linus and now f/w Dr Epifanio at [facility]  Was seen by EP/Dr Epifanio office on 11/12, plan to reduce sotalol  and add metoprolol .  Plan to start flecainide after transition to metoprolol .  She has prior ablation, pending pacemaker evaluation for sinus node dysfunction  Patient reports that she is no longer taking diltiazem , she is on metoprolol  after weaning off sotalol .  Reports she is currently taking apixaban without any missed doses, Synthroid, magnesium and Toprol -XL  She has had increased fatigue over the past few days, has been in and out of A-fib over the past few days.  She has no chest pain or palpitations but does feel intermittently woozy.  Similar to prior episodes of A-fib, a flutter.  Last night heart rate persistently around 130 on her smart watch.  She took her regular medications including apixaban and metoprolol  and symptoms have persisted.  She called nurse line at her doctor's office and they advised her to come to the ER.  She has no chest pain, palpitations or dyspnea, no vomiting or nausea, no diarrhea.  No fever.  Past Medical History Past Medical History:  Diagnosis Date   Paroxysmal atrial fibrillation [facility])    Thyroid disease    Patient Active Problem List   Diagnosis Date Noted   Shortness of breath 05/02/2021   Atrial fibrillation (HCC) 05/02/2021   Hypothyroidism 05/02/2021   Home Medication(s) Prior to Admission medications   Medication Sig Start Date End Date Taking? Authorizing Provider   diltiazem  (CARDIZEM  CD) 120 MG 24 hr capsule Take 120 mg by mouth daily. 11/16/20  Yes [provider]  ELIQUIS 5 MG TABS tablet Take by mouth. 04/09/21  Yes [provider]  furosemide  (LASIX ) 20 MG tablet Take 1 tablet (20 mg total) by mouth daily for 5 days. 12/08/23 12/13/23 Yes Elnor Jayson LABOR, DO  levothyroxine (SYNTHROID) 50 MCG tablet Take 50 mcg by mouth daily. 11/14/20  Yes [provider]  sotalol  (BETAPACE ) 80 MG tablet Take by mouth. 04/19/21  Yes [provider]  aspirin 81 MG EC tablet Take 81 mg by mouth daily. Swallow whole.    [provider]  diltiazem  (CARDIZEM ) 60 MG tablet Take 1 tablet (60 mg total) by mouth 3 (three) times daily as needed for up to 21 doses. For persistent resting heart rate above 110 beats per minute 01/20/23   Cottie Donnice PARAS, MD  levothyroxine (SYNTHROID, LEVOTHROID) 88 MCG tablet Take 88 mcg by mouth daily before breakfast. Patient not taking: Reported on 05/02/2021    [provider]  magnesium oxide (MAG-OX) 400 MG tablet Take 400 mg by mouth daily.    [provider]  potassium chloride  (KLOR-CON ) 10 MEQ tablet Take 1 tablet (10 mEq total) by mouth daily for 5 doses. 01/20/23 01/25/23  Cottie Donnice PARAS, MD  Past Surgical History Past Surgical History:  Procedure Laterality Date   AUGMENTATION MAMMAPLASTY Bilateral    CESAREAN SECTION     1977   CESAREAN SECTION     1982   Family History Family History  Problem Relation Age of Onset   Breast cancer Neg Hx     Social History Social History   Tobacco Use   Smoking status: Never    Passive exposure: Never   Smokeless tobacco: Never  Vaping Use   Vaping status: Never Used  Substance Use Topics   Alcohol use: Yes    Comment: soc   Allergies Patient has no known allergies.  Review of Systems A  thorough review of systems was obtained and all systems are negative except as noted in the HPI and PMH.   Physical Exam Vital Signs  I have reviewed the triage vital signs BP 117/73   Pulse 78   Temp (!) 96.9 F (36.1 C)   Resp (!) 24   Wt 67.6 kg   SpO2 97%   BMI 25.58 kg/m  Physical Exam Vitals and nursing note reviewed.  Constitutional:      General: She is not in acute distress.    Appearance: Normal appearance. She is not ill-appearing.  HENT:     Head: Normocephalic and atraumatic.     Right Ear: External ear normal.     Left Ear: External ear normal.     Nose: Nose normal.     Mouth/Throat:     Mouth: Mucous membranes are moist.  Eyes:     General: No scleral icterus.       Right eye: No discharge.        Left eye: No discharge.  Cardiovascular:     Rate and Rhythm: Tachycardia present.     Pulses: Normal pulses.     Heart sounds: Normal heart sounds.  Pulmonary:     Effort: Pulmonary effort is normal. No respiratory distress.     Breath sounds: Normal breath sounds. No stridor.  Abdominal:     General: Abdomen is flat. There is no distension.     Palpations: Abdomen is soft.     Tenderness: There is no abdominal tenderness.  Musculoskeletal:     Cervical back: No rigidity.     Right lower leg: No edema.     Left lower leg: No edema.  Skin:    General: Skin is warm and dry.     Capillary Refill: Capillary refill takes less than 2 seconds.  Neurological:     Mental Status: She is alert.  Psychiatric:        Mood and Affect: Mood normal.        Behavior: Behavior normal. Behavior is cooperative.     ED Results and Treatments Labs (all labs ordered are listed, but only abnormal results are displayed) Labs Reviewed  PRO BRAIN NATRIURETIC PEPTIDE - Abnormal; Notable for the following components:      Result Value   Pro Brain Natriuretic Peptide 8,243.0 (*)    All other components within normal limits  COMPREHENSIVE METABOLIC PANEL WITH GFR -  Abnormal; Notable for the following components:   Glucose, Bld 142 (*)    BUN 25 (*)    Creatinine, Ser 1.15 (*)    AST 59 (*)    ALT 47 (*)    GFR, Estimated 49 (*)    All other components within normal limits  TROPONIN T, HIGH SENSITIVITY - Abnormal; Notable for the following components:  Troponin T High Sensitivity 21 (*)    All other components within normal limits  CBC WITH DIFFERENTIAL/PLATELET  TSH  MAGNESIUM  CBC  T4, FREE  TROPONIN T, HIGH SENSITIVITY                                                                                                                          Radiology DG Chest Port 1 View Result Date: 12/08/2023 CLINICAL DATA:  Palpitations.  Atrial fibrillation. EXAM: PORTABLE CHEST 1 VIEW COMPARISON:  10/19/2023 FINDINGS: The heart size and mediastinal contours are within normal limits. Both lungs are clear. Bilateral breast implants with capsular calcification again noted. IMPRESSION: No active disease. Electronically Signed   By: Norleen DELENA Kil M.D.   On: 12/08/2023 08:49    Pertinent labs & imaging results that were available during my care of the patient were reviewed by me and considered in my medical decision making (see MDM for details).  Medications Ordered in ED Medications  sodium chloride  0.9 % bolus 1,000 mL (0 mLs Intravenous Stopped 12/08/23 0925)  diltiazem  (CARDIZEM ) injection 20 mg (20 mg Intravenous Given 12/08/23 0858)                                                                                                                                     Procedures .Critical Care  Performed by: Elnor Jayson DELENA, DO Authorized by: Elnor Jayson DELENA, DO   Critical care provider statement:    Critical care time (minutes):  30   Critical care time was exclusive of:  Separately billable procedures and treating other patients   Critical care was necessary to treat or prevent imminent or life-threatening deterioration of the following conditions:   Cardiac failure (atrial flutter w/ rapid response)   Critical care was time spent personally by me on the following activities:  Development of treatment plan with patient or surrogate, discussions with consultants, evaluation of patient's response to treatment, examination of patient, ordering and review of laboratory studies, ordering and review of radiographic studies, ordering and performing treatments and interventions, pulse oximetry, re-evaluation of patient's condition, review of old charts and obtaining history from patient or surrogate   Care discussed with comment:  EP specialist at [facility]   (including critical care time)  Medical Decision Making / ED Course    Medical Decision Making:    Seva D Kawasaki is a 78 y.o. female  with past medical history as below, significant for paroxysmal atrial fibrillation, atypical atrial flutter, sinus node dysfunction, hypothyroid who presents to the ED with complaint of elevated HR. The complaint involves an extensive differential diagnosis and also carries with it a high risk of complications and morbidity.  Serious etiology was considered. Ddx includes but is not limited to: Sinus tachycardia, thyroid  dysfunction, atrial flutter, atrial fibrillation, dehydration, WPW etc.  Complete initial physical exam performed, notably the patient was in no acute distress, heart rate is elevated.    Reviewed and confirmed nursing documentation for past medical history, family history, social history.  Vital signs reviewed.    Atypical atrial flutter> - Patient with history of atypical atrial flutter, follows with cardiology at [facility].  Was recently transitioned off of sotalol  and started metoprolol .  No longer taking diltiazem .  She is anticoagulated on Eliquis - Reports compliance with her metoprolol  - Her blood pressure stable, given dose of diltiazem , heart rate has greatly improved - Her BNP is elevated, she has no evidence of fluid overload on exam, no  pulmonary edema or pleural effusion, no hypoxia.  She does have chronically elevated BNP, favor this is likely secondary to her atrial flutter. Will give 5 day course lasix , defer further mgmt to primary cardiology team - Patient reports feeling much better, no chest pain or dyspnea.  Clinical Course as of 12/08/23 1156  Sat Dec 08, 2023  1005 HR improved w/ dilt, remains in flutter [SG]  [address] Epifanio Alm HERO, MD  [address]  [place], [place] [address]  [phone] (Work)   [SG]  1105 [facility]/ [facility], recommend f/u office. Can take 2nd dose metoprolol  at home if HR becomes elevated again. O/w no med changes, f/u office next week [SG]    Clinical Course User Index [SG] Elnor Jayson LABOR, DO      I have discussed the diagnosis/risks/treatment options with the patient.  Evaluation and diagnostic testing in the emergency department does not suggest an emergent condition requiring admission or immediate intervention beyond what has been performed at this time.  They will follow up with PCP/ EP. We also discussed returning to the ED immediately if new or worsening sx occur. We discussed the sx which are most concerning (e.g., sudden worsening pain, fever, inability to tolerate by mouth) that necessitate immediate return.    The patient appears reasonably screened and/or stabilized for discharge and I doubt any other medical condition or other [facility] requiring further screening, evaluation, or treatment in the ED at this time prior to discharge.                 Additional history obtained: -Additional history obtained from na -External records from outside source obtained and reviewed including: Chart review including previous notes, labs, imaging, consultation notes including  Recent ER evaluation Outpatient cardiology documentation   Lab Tests: -I ordered, reviewed, and interpreted labs.   The pertinent results include:   Labs Reviewed  PRO BRAIN NATRIURETIC  PEPTIDE - Abnormal; Notable for the following components:      Result Value   Pro Brain Natriuretic Peptide 8,243.0 (*)    All other components within normal limits  COMPREHENSIVE METABOLIC PANEL WITH GFR - Abnormal; Notable for the following components:   Glucose, Bld 142 (*)    BUN 25 (*)    Creatinine, Ser 1.15 (*)    AST 59 (*)    ALT 47 (*)    GFR, Estimated 49 (*)  All other components within normal limits  TROPONIN T, HIGH SENSITIVITY - Abnormal; Notable for the following components:   Troponin T High Sensitivity 21 (*)    All other components within normal limits  CBC WITH DIFFERENTIAL/PLATELET  TSH  MAGNESIUM  CBC  T4, FREE  TROPONIN T, HIGH SENSITIVITY    Notable for proBNP is elevated  EKG   EKG Interpretation Date/Time:  Saturday December 08 2023 08:12:31 EST Ventricular Rate:  128 PR Interval:  56 QRS Duration:  69 QT Interval:  287 QTC Calculation: 419 R Axis:   46  Text Interpretation: Sinus tachycardia Borderline repolarization abnormality  flutter vs tachycardia Confirmed by Elnor Savant ([id number]) on 12/08/2023 8:15:02 AM         Imaging Studies ordered: I ordered imaging studies including CXR I independently visualized the following imaging with scope of interpretation limited to determining acute life threatening conditions related to emergency care; findings noted above I agree with the radiologist interpretation If any imaging was obtained with contrast I closely monitored patient for any possible adverse reaction a/w contrast administration in the emergency department   Medicines ordered and prescription drug management: Meds ordered this encounter  Medications   sodium chloride  0.9 % bolus 1,000 mL   diltiazem  (CARDIZEM ) injection 20 mg   furosemide  (LASIX ) 20 MG tablet    Sig: Take 1 tablet (20 mg total) by mouth daily for 5 days.    Dispense:  5 tablet    Refill:  0    -I have reviewed the patients home medicines and have made  adjustments as needed   Consultations Obtained: I requested consultation with the cardiology,  and discussed lab and imaging findings as well as pertinent plan - they recommend: 2nd metop prn, f/u office nxt week   Cardiac Monitoring: The patient was maintained on a cardiac monitor.  I personally viewed and interpreted the cardiac monitored which showed an underlying rhythm of: aflutter Continuous pulse oximetry interpreted by myself, 98% on RA.    Social Determinants of Health:  Diagnosis or treatment significantly limited by social determinants of health: na   Reevaluation: After the interventions noted above, I reevaluated the patient and found that they have improved  Co morbidities that complicate the patient evaluation  Past Medical History:  Diagnosis Date   Paroxysmal atrial fibrillation (HCC)    Thyroid disease       Dispostion: Disposition decision including need for hospitalization was considered, and patient discharged from emergency department.    Final Clinical Impression(s) / ED Diagnoses Final diagnoses:  Atrial flutter with rapid ventricular response (HCC)        Elnor Savant LABOR, DO 12/08/23 1156    Elnor Savant LABOR, DO 12/08/23 1156

## 2023-12-08 NOTE — ED Triage Notes (Signed)
 Ambulatory to triage.States she has been in and out of a fib since Tuesday.  Now sustained Pt getting notification on watch. Denies pain. Some SOB  Took eliquis this morning

## 2023-12-08 NOTE — Telephone Encounter (Signed)
 Regarding: Possible A-Fib ----- Message from Reena RAMAN sent at 12/08/2023  6:33 AM EST ----- Clinic Name:  [facility] Caller/Relationship:  Self Reason for call:  Thinks she's in A-Fib Additional comment:
